# Patient Record
Sex: Female | Born: 1980 | Race: White | Hispanic: No | Marital: Single | Smoking: Former smoker
Health system: Southern US, Community
[De-identification: ages and names within clinical notes are randomized; demographics above are authoritative.]

## PROBLEM LIST (undated history)

## (undated) DIAGNOSIS — I471 Supraventricular tachycardia, unspecified: Secondary | ICD-10-CM

## (undated) DIAGNOSIS — Q796 Ehlers-Danlos syndrome, unspecified: Secondary | ICD-10-CM

## (undated) DIAGNOSIS — I951 Orthostatic hypotension: Secondary | ICD-10-CM

## (undated) DIAGNOSIS — F4310 Post-traumatic stress disorder, unspecified: Secondary | ICD-10-CM

## (undated) DIAGNOSIS — F341 Dysthymic disorder: Secondary | ICD-10-CM

## (undated) DIAGNOSIS — R5382 Chronic fatigue, unspecified: Secondary | ICD-10-CM

## (undated) DIAGNOSIS — Z8679 Personal history of other diseases of the circulatory system: Secondary | ICD-10-CM

## (undated) DIAGNOSIS — M797 Fibromyalgia: Secondary | ICD-10-CM

## (undated) DIAGNOSIS — F419 Anxiety disorder, unspecified: Secondary | ICD-10-CM

## (undated) DIAGNOSIS — M8580 Other specified disorders of bone density and structure, unspecified site: Secondary | ICD-10-CM

## (undated) DIAGNOSIS — F329 Major depressive disorder, single episode, unspecified: Secondary | ICD-10-CM

## (undated) DIAGNOSIS — O1490 Unspecified pre-eclampsia, unspecified trimester: Secondary | ICD-10-CM

## (undated) DIAGNOSIS — Q7962 Hypermobile Ehlers-Danlos syndrome: Secondary | ICD-10-CM

## (undated) DIAGNOSIS — G629 Polyneuropathy, unspecified: Secondary | ICD-10-CM

## (undated) DIAGNOSIS — Z79899 Other long term (current) drug therapy: Secondary | ICD-10-CM

## (undated) DIAGNOSIS — G5600 Carpal tunnel syndrome, unspecified upper limb: Secondary | ICD-10-CM

## (undated) DIAGNOSIS — N200 Calculus of kidney: Secondary | ICD-10-CM

## (undated) DIAGNOSIS — R000 Tachycardia, unspecified: Secondary | ICD-10-CM

## (undated) DIAGNOSIS — G90A Postural orthostatic tachycardia syndrome (POTS): Secondary | ICD-10-CM

## (undated) DIAGNOSIS — I498 Other specified cardiac arrhythmias: Secondary | ICD-10-CM

## (undated) HISTORY — PX: HERNIA REPAIR: SHX51

## (undated) HISTORY — DX: Supraventricular tachycardia: I47.1

## (undated) HISTORY — DX: Postural orthostatic tachycardia syndrome (POTS): G90.A

## (undated) HISTORY — DX: Post-traumatic stress disorder, unspecified: F43.10

## (undated) HISTORY — DX: Personal history of other diseases of the circulatory system: Z86.79

## (undated) HISTORY — DX: Supraventricular tachycardia, unspecified: I47.10

## (undated) HISTORY — DX: Hypermobile Ehlers-Danlos syndrome: Q79.62

## (undated) HISTORY — DX: Dysthymic disorder: F34.1

## (undated) HISTORY — DX: Orthostatic hypotension: I95.1

## (undated) HISTORY — DX: Anxiety disorder, unspecified: F41.9

## (undated) HISTORY — DX: Tachycardia, unspecified: R00.0

## (undated) HISTORY — DX: Major depressive disorder, single episode, unspecified: F32.9

## (undated) HISTORY — DX: Other long term (current) drug therapy: Z79.899

## (undated) HISTORY — PX: BREAST ENHANCEMENT SURGERY: SHX7

## (undated) HISTORY — DX: Calculus of kidney: N20.0

## (undated) HISTORY — DX: Other specified disorders of bone density and structure, unspecified site: M85.80

## (undated) HISTORY — DX: Fibromyalgia: M79.7

## (undated) HISTORY — DX: Chronic fatigue, unspecified: R53.82

## (undated) HISTORY — DX: Polyneuropathy, unspecified: G62.9

## (undated) HISTORY — DX: Ehlers-Danlos syndrome, unspecified: Q79.60

## (undated) HISTORY — DX: Carpal tunnel syndrome, unspecified upper limb: G56.00

## (undated) HISTORY — DX: Other specified cardiac arrhythmias: I49.8

## (undated) HISTORY — PX: ABDOMINOPLASTY: SUR9

---

## 1999-03-07 DIAGNOSIS — F32A Depression, unspecified: Secondary | ICD-10-CM

## 1999-03-07 HISTORY — DX: Depression, unspecified: F32.A

## 2016-10-24 DIAGNOSIS — G5602 Carpal tunnel syndrome, left upper limb: Secondary | ICD-10-CM | POA: Diagnosis not present

## 2016-10-24 DIAGNOSIS — Q796 Ehlers-Danlos syndrome: Secondary | ICD-10-CM | POA: Diagnosis not present

## 2016-10-24 DIAGNOSIS — M797 Fibromyalgia: Secondary | ICD-10-CM | POA: Diagnosis not present

## 2016-10-24 DIAGNOSIS — M150 Primary generalized (osteo)arthritis: Secondary | ICD-10-CM | POA: Diagnosis not present

## 2016-10-24 DIAGNOSIS — G5601 Carpal tunnel syndrome, right upper limb: Secondary | ICD-10-CM | POA: Diagnosis not present

## 2016-12-26 DIAGNOSIS — Q796 Ehlers-Danlos syndrome: Secondary | ICD-10-CM | POA: Diagnosis not present

## 2016-12-26 DIAGNOSIS — G5602 Carpal tunnel syndrome, left upper limb: Secondary | ICD-10-CM | POA: Diagnosis not present

## 2016-12-26 DIAGNOSIS — G5601 Carpal tunnel syndrome, right upper limb: Secondary | ICD-10-CM | POA: Diagnosis not present

## 2016-12-26 DIAGNOSIS — M150 Primary generalized (osteo)arthritis: Secondary | ICD-10-CM | POA: Diagnosis not present

## 2016-12-26 DIAGNOSIS — M797 Fibromyalgia: Secondary | ICD-10-CM | POA: Diagnosis not present

## 2017-01-04 DIAGNOSIS — M797 Fibromyalgia: Secondary | ICD-10-CM

## 2017-01-04 HISTORY — DX: Fibromyalgia: M79.7

## 2017-02-20 DIAGNOSIS — Z79891 Long term (current) use of opiate analgesic: Secondary | ICD-10-CM | POA: Diagnosis not present

## 2017-02-20 DIAGNOSIS — G5602 Carpal tunnel syndrome, left upper limb: Secondary | ICD-10-CM | POA: Diagnosis not present

## 2017-02-20 DIAGNOSIS — M797 Fibromyalgia: Secondary | ICD-10-CM | POA: Diagnosis not present

## 2017-02-20 DIAGNOSIS — G5601 Carpal tunnel syndrome, right upper limb: Secondary | ICD-10-CM | POA: Diagnosis not present

## 2017-02-20 DIAGNOSIS — M150 Primary generalized (osteo)arthritis: Secondary | ICD-10-CM | POA: Diagnosis not present

## 2017-02-20 DIAGNOSIS — Q796 Ehlers-Danlos syndrome: Secondary | ICD-10-CM | POA: Diagnosis not present

## 2017-02-21 DIAGNOSIS — M25562 Pain in left knee: Secondary | ICD-10-CM | POA: Diagnosis not present

## 2017-04-27 DIAGNOSIS — M150 Primary generalized (osteo)arthritis: Secondary | ICD-10-CM | POA: Diagnosis not present

## 2017-04-27 DIAGNOSIS — M797 Fibromyalgia: Secondary | ICD-10-CM | POA: Diagnosis not present

## 2017-04-27 DIAGNOSIS — Z79891 Long term (current) use of opiate analgesic: Secondary | ICD-10-CM | POA: Diagnosis not present

## 2017-04-27 DIAGNOSIS — G5601 Carpal tunnel syndrome, right upper limb: Secondary | ICD-10-CM | POA: Diagnosis not present

## 2017-04-27 DIAGNOSIS — Q796 Ehlers-Danlos syndrome: Secondary | ICD-10-CM | POA: Diagnosis not present

## 2017-04-27 DIAGNOSIS — G5602 Carpal tunnel syndrome, left upper limb: Secondary | ICD-10-CM | POA: Diagnosis not present

## 2017-05-08 DIAGNOSIS — F3342 Major depressive disorder, recurrent, in full remission: Secondary | ICD-10-CM | POA: Diagnosis not present

## 2017-05-08 DIAGNOSIS — K089 Disorder of teeth and supporting structures, unspecified: Secondary | ICD-10-CM | POA: Diagnosis not present

## 2017-06-22 DIAGNOSIS — M150 Primary generalized (osteo)arthritis: Secondary | ICD-10-CM | POA: Diagnosis not present

## 2017-06-22 DIAGNOSIS — M797 Fibromyalgia: Secondary | ICD-10-CM | POA: Diagnosis not present

## 2017-06-22 DIAGNOSIS — Q796 Ehlers-Danlos syndrome: Secondary | ICD-10-CM | POA: Diagnosis not present

## 2017-06-22 DIAGNOSIS — Z79891 Long term (current) use of opiate analgesic: Secondary | ICD-10-CM | POA: Diagnosis not present

## 2017-06-22 DIAGNOSIS — G5601 Carpal tunnel syndrome, right upper limb: Secondary | ICD-10-CM | POA: Diagnosis not present

## 2017-06-22 DIAGNOSIS — G5602 Carpal tunnel syndrome, left upper limb: Secondary | ICD-10-CM | POA: Diagnosis not present

## 2017-07-31 ENCOUNTER — Telehealth: Payer: Self-pay | Admitting: Osteopathic Medicine

## 2017-07-31 ENCOUNTER — Ambulatory Visit: Payer: Self-pay | Admitting: Osteopathic Medicine

## 2017-07-31 NOTE — Telephone Encounter (Signed)
PT arrived at 840 didn't realize appt was for arrival 810. Gave new appt for 6/4 arrival time and card states130pm.

## 2017-07-31 NOTE — Telephone Encounter (Signed)
No-show to establish care with Dr. Lyn Hollingshead, 07/31/17. If late or no-show again, will not accept patient to this clinic. Please call her to reschedule visit and inform them of this policy.

## 2017-08-07 ENCOUNTER — Ambulatory Visit (INDEPENDENT_AMBULATORY_CARE_PROVIDER_SITE_OTHER): Payer: Medicare Other | Admitting: Osteopathic Medicine

## 2017-08-07 ENCOUNTER — Encounter: Payer: Self-pay | Admitting: Osteopathic Medicine

## 2017-08-07 ENCOUNTER — Encounter (INDEPENDENT_AMBULATORY_CARE_PROVIDER_SITE_OTHER): Payer: Self-pay

## 2017-08-07 VITALS — BP 108/68 | HR 84 | Temp 98.0°F | Ht 67.0 in | Wt 147.0 lb

## 2017-08-07 DIAGNOSIS — F4310 Post-traumatic stress disorder, unspecified: Secondary | ICD-10-CM | POA: Insufficient documentation

## 2017-08-07 DIAGNOSIS — G8929 Other chronic pain: Secondary | ICD-10-CM

## 2017-08-07 DIAGNOSIS — Z8679 Personal history of other diseases of the circulatory system: Secondary | ICD-10-CM | POA: Insufficient documentation

## 2017-08-07 DIAGNOSIS — G629 Polyneuropathy, unspecified: Secondary | ICD-10-CM

## 2017-08-07 DIAGNOSIS — I471 Supraventricular tachycardia, unspecified: Secondary | ICD-10-CM

## 2017-08-07 DIAGNOSIS — F418 Other specified anxiety disorders: Secondary | ICD-10-CM | POA: Diagnosis not present

## 2017-08-07 DIAGNOSIS — R000 Tachycardia, unspecified: Secondary | ICD-10-CM

## 2017-08-07 DIAGNOSIS — Q796 Ehlers-Danlos syndrome: Secondary | ICD-10-CM

## 2017-08-07 DIAGNOSIS — M797 Fibromyalgia: Secondary | ICD-10-CM

## 2017-08-07 DIAGNOSIS — I951 Orthostatic hypotension: Secondary | ICD-10-CM

## 2017-08-07 DIAGNOSIS — G90A Postural orthostatic tachycardia syndrome (POTS): Secondary | ICD-10-CM

## 2017-08-07 DIAGNOSIS — Q7962 Hypermobile Ehlers-Danlos syndrome: Secondary | ICD-10-CM

## 2017-08-07 DIAGNOSIS — R5382 Chronic fatigue, unspecified: Secondary | ICD-10-CM

## 2017-08-07 DIAGNOSIS — M8580 Other specified disorders of bone density and structure, unspecified site: Secondary | ICD-10-CM | POA: Diagnosis not present

## 2017-08-07 DIAGNOSIS — G9332 Myalgic encephalomyelitis/chronic fatigue syndrome: Secondary | ICD-10-CM

## 2017-08-07 HISTORY — DX: Personal history of other diseases of the circulatory system: Z86.79

## 2017-08-07 HISTORY — DX: Supraventricular tachycardia: I47.1

## 2017-08-07 HISTORY — DX: Hypermobile Ehlers-Danlos syndrome: Q79.62

## 2017-08-07 HISTORY — DX: Supraventricular tachycardia, unspecified: I47.10

## 2017-08-07 HISTORY — DX: Fibromyalgia: M79.7

## 2017-08-07 HISTORY — DX: Myalgic encephalomyelitis/chronic fatigue syndrome: G93.32
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
## 2017-08-17 ENCOUNTER — Telehealth: Payer: Self-pay | Admitting: Osteopathic Medicine

## 2017-08-17 NOTE — Telephone Encounter (Signed)
Records were requested from [place] previous doctors [place].  I have not seen anything come across my desk yet.  Do we still have the release on file, particularly for her pain management specialist and PCP?  If so, can we call these offices and see if they can send us the records? Thanks

## 2017-08-20 NOTE — Telephone Encounter (Signed)
Records received and reviewed:  Last visit w/ pain mgt Dr Hazelton [facility]arma [facility] 06[facility], meds on list: Hydrocodone-APAP 10-325 mg 1 tab po q4 h (#180 Rx, 2 Rx's w/o refills) Ultram 50 mg 2 tab po q6 h (#240 Rx w/ 1 refill) Gabapentin 300 mg 1 tab po qhs (#30 w/ 5 refill) F/u advised in 2 mos - pt has since moved to [place]  Will scan to chart and pt will be provided a copy at her next visit w/ me tomorrow

## 2017-08-21 ENCOUNTER — Encounter: Payer: Self-pay | Admitting: Osteopathic Medicine

## 2017-08-21 ENCOUNTER — Ambulatory Visit (INDEPENDENT_AMBULATORY_CARE_PROVIDER_SITE_OTHER): Payer: Medicare Other | Admitting: Osteopathic Medicine

## 2017-08-21 VITALS — BP 114/84 | HR 109 | Temp 98.3°F | Wt 146.3 lb

## 2017-08-21 DIAGNOSIS — G629 Polyneuropathy, unspecified: Secondary | ICD-10-CM

## 2017-08-21 DIAGNOSIS — M797 Fibromyalgia: Secondary | ICD-10-CM

## 2017-08-21 DIAGNOSIS — G8929 Other chronic pain: Secondary | ICD-10-CM

## 2017-08-21 DIAGNOSIS — Q796 Ehlers-Danlos syndrome: Secondary | ICD-10-CM | POA: Diagnosis not present

## 2017-08-21 DIAGNOSIS — Z79899 Other long term (current) drug therapy: Secondary | ICD-10-CM

## 2017-08-21 DIAGNOSIS — Q7962 Hypermobile Ehlers-Danlos syndrome: Secondary | ICD-10-CM

## 2017-08-21 HISTORY — DX: Other long term (current) drug therapy: Z79.899

## 2017-08-21 MED ORDER — HYDROCODONE-ACETAMINOPHEN 10-325 MG PO TABS: 1.0000 | ORAL_TABLET | ORAL | 0 refills | Status: DC

## 2017-08-21 NOTE — Patient Instructions (Signed)
   Urine drug screen TODAY 08/21/17   Pain management referral: I'll follow up on this in the next couple weeks to see when they can get you in.   Let's plan to recheck things in another 4 weeks, see how you're doing on the Tramadol.

## 2017-08-21 NOTE — Progress Notes (Signed)
HPI: Marisa Harvey is a 37 y.o. female who  has a past medical history of Anxiety, Carpal tunnel syndrome, Chronic fatigue syndrome, Chronic fatigue syndrome with fibromyalgia (08/07/2017), Depression (2001), Ehlers-Danlos syndrome, Fibromyalgia (01/2017), Heart murmur, Mitral valve prolapse, Neuropathy, Osteopenia, POTS (postural orthostatic tachycardia syndrome), PTSD (post-traumatic stress disorder), and Supraventricular tachycardia (HCC).  she presents to [facility] today, 08/21/17,  for chief complaint of:  Pain management  Records received and reviewed:  Last visit w/ pain mgt Dr  Callas ([facility] in [facility]) 06/22/17, meds on list: Hydrocodone-APAP 10-325 mg 1 tab po q4 h (#180 Rx, 2 Rx's w/o refills) Ultram 50 mg 2 tab po q6 h (#240 Rx w/ 1 refill) Gabapentin 300 mg 1 tab po qhs (#30 w/ 5 refill) F/u advised in 2 mos - pt has since moved to [facility]   PMP reviewed: last  Fill of Hydrocodone and Tramadol was 07/23/17, due for refill tomorrow 08/22/17. See scanned docs for PMP review: has been filling #180 Hydrocodone-APAP and #240 of Tramadol pretty consistently on a monthly basis.   She reported at her visit 08/07/17 to establish care that she takes the Hydrocodone-APAP 6 AM, 10 AM, 2 PM, few times in the evening. If filling #180 per month. She reported that she uses the tramadol 2 tabs at a time a few times per day but isn't sure it's really helping much. .      Past medical history, surgical history, and family history reviewed.  Current medication list and allergy/intolerance information reviewed.   (See remainder of HPI, ROS, Phys Exam below)    ASSESSMENT/PLAN:   Ehlers-Danlos syndrome type III - chronic pain associated with this   Fibromyalgia - Plan: Pain Mgmt [facility] w/Conf, Ur  Neuropathy - Plan: Pain Mgmt [facility] w/Conf, Ur  Other chronic pain - she has tramaodl left over, "I'm not taking the tramadol as much as they give me" and not sure it's  helping, discussed take 1 instead of 2, see how it does?   Controlled substance agreement signed - signed 08/21/17 see scanned docs    Meds ordered this encounter  Medications  . HYDROcodone-acetaminophen (NORCO) 10-325 MG tablet    Sig: Take 1 tablet by mouth every 4 (four) hours.    Dispense:  180 tablet    Refill:  0    Patient Instructions   Urine drug screen TODAY 08/21/17   Pain management referral: I'll follow up on this in the next couple weeks to see when they can get you in.   Let's plan to recheck things in another 4 weeks, see how you're doing on the Tramadol.    Follow-up plan: Return in about 1 month (around 09/18/2017) for medication refills, sooner if needed .     ############################################ ############################################ ############################################ ############################################    Outpatient Encounter Medications as of 08/21/2017  Medication Sig  . atenolol (TENORMIN) 25 MG tablet Take 25 mg by mouth 2 (two) times daily.  . DULoxetine (CYMBALTA) 30 MG capsule Take 30 mg by mouth at bedtime.  . DULoxetine (CYMBALTA) 60 MG capsule Take 60 mg by mouth at bedtime.  Marland Kitchen escitalopram (LEXAPRO) 20 MG tablet Take 20 mg by mouth at bedtime.  . gabapentin (NEURONTIN) 300 MG capsule Take 300 mg by mouth at bedtime.  Marland Kitchen HYDROcodone-acetaminophen (NORCO) 10-325 MG tablet Take 1 tablet by mouth every 6 (six) hours as needed (taken at 6 am, 10 am & 2 pm).  . midodrine (PROAMATINE) 10 MG tablet Take 10 mg  by mouth 2 (two) times daily.  . traMADol (ULTRAM) 50 MG tablet Take 50 mg by mouth every 6 (six) hours as needed.   No facility-administered encounter medications on file as of 08/21/2017.    Allergies  Allergen Reactions  . Midodrine     "Makes my head tingle like 15 mins after I take it"      Review of Systems:  Constitutional: No recent illness  HEENT: No  headache, no vision change  Cardiac: No   chest pain, No  pressure, No palpitations  Respiratory:  No  shortness of breath.   Gastrointestinal: No  abdominal pain  Musculoskeletal: No new myalgia/arthralgia  Neurologic: No  weakness, No  Dizziness   Exam:  BP 114/84 (BP Location: Left Arm, Patient Position: Sitting, Cuff Size: Normal)   Pulse (!) 109   Temp 98.3 F (36.8 C) (Oral)   Wt 146 lb 4.8 oz (66.4 kg)   LMP 08/06/2017   BMI 22.91 kg/m    Constitutional: VS see above. General Appearance: alert, well-developed, well-nourished, NAD  Respiratory: Normal respiratory effort.   Musculoskeletal: Gait normal. Symmetric and independent movement of all extremities  Neurological: Normal balance/coordination. No tremor.  Skin: warm, dry, intact.   Psychiatric: Normal judgment/insight. Normal mood and affect. Oriented x3.   Visit summary with medication list and pertinent instructions was printed for patient to review, advised to alert us if any changes needed. All questions at time of visit were answered - patient instructed to contact office with any additional concerns. ER/RTC precautions were reviewed with the patient and understanding verbalized.   Follow-up plan: Return in about 1 month (around 09/18/2017) for medication refills, sooner if needed .  Note: Total time spent 25 minutes, greater than 50% of the visit was spent face-to-face counseling and coordinating care for the following: The primary encounter diagnosis was Ehlers-Danlos syndrome type III. Diagnoses of Fibromyalgia, Neuropathy, and Other chronic pain were also pertinent to this visit.Marland Kitchen.  Please note: voice recognition software was used to produce this document, and typos may escape review. Please contact Dr. Lyn HollingsheadAlexander for any needed clarifications.

## 2017-08-25 LAB — PAIN MGMT BASE W/CONF, UR
ALPHAHYDROXYTRIAZOLAM: NEGATIVE ng/mL (ref <50)
Alphahydroxyalprazolam: NEGATIVE ng/mL (ref <25)
Alphahydroxymidazolam: NEGATIVE ng/mL (ref <50)
Benzodiazepines: NEGATIVE ng/mL (ref <100)
COCAINE METABOLITE: NEGATIVE ng/mL (ref <150)
CODEINE: NEGATIVE ng/mL (ref <50)
Creatinine: 298.5 mg/dL
Hydrocodone: 6590 ng/mL — ABNORMAL HIGH (ref <50)
Hydromorphone: 725 ng/mL — ABNORMAL HIGH (ref <50)
Hydroxyethylflurazepam: NEGATIVE ng/mL (ref <50)
Lorazepam: NEGATIVE ng/mL (ref <50)
MORPHINE: NEGATIVE ng/mL (ref <50)
NORDIAZEPAM: NEGATIVE ng/mL (ref <50)
NORHYDROCODONE: 10384 ng/mL — AB (ref <50)
OXYCODONE: NEGATIVE ng/mL (ref <100)
Opiates: POSITIVE ng/mL — AB (ref <100)
Oxazepam: NEGATIVE ng/mL (ref <50)
Oxidant: NEGATIVE ug/mL (ref <200)
Temazepam: NEGATIVE ng/mL (ref <50)
pH: 7.09 (ref 4.5–9.0)

## 2017-08-28 ENCOUNTER — Telehealth: Payer: Self-pay | Admitting: Osteopathic Medicine

## 2017-08-28 NOTE — Telephone Encounter (Signed)
-----   Message from Sunnie NielsenNatalie Alexander, DO sent at 08/28/2017 11:12 AM EDT ----- Regarding: FW: declined referral [facility] referral - anywhere else we can try?  Can let patient know - will try to get her in elsewhere but I am ok to continue meds as-is, though if there are ever any changes needed I'd like the input of a specialist.   Thanks!   ----- Message ----- From: Jens SomKellner, Lisa L Sent: 08/28/2017  10:14 AM To: Sunnie NielsenNatalie Alexander, DO Subject: declined referral                              Per clinical review nothing further we can offer  - Thank you for the referral

## 2017-08-29 NOTE — Telephone Encounter (Signed)
Done

## 2017-08-30 ENCOUNTER — Encounter: Payer: Self-pay | Admitting: Osteopathic Medicine

## 2017-08-30 DIAGNOSIS — G8929 Other chronic pain: Secondary | ICD-10-CM

## 2017-08-30 DIAGNOSIS — Q7962 Hypermobile Ehlers-Danlos syndrome: Secondary | ICD-10-CM

## 2017-08-30 DIAGNOSIS — G629 Polyneuropathy, unspecified: Secondary | ICD-10-CM

## 2017-08-30 DIAGNOSIS — M797 Fibromyalgia: Secondary | ICD-10-CM

## 2017-08-30 NOTE — Progress Notes (Signed)
New referral placed for Pt to try a different facility.

## 2017-09-17 ENCOUNTER — Ambulatory Visit (INDEPENDENT_AMBULATORY_CARE_PROVIDER_SITE_OTHER): Payer: Medicare Other | Admitting: Licensed Clinical Social Worker

## 2017-09-17 DIAGNOSIS — F4010 Social phobia, unspecified: Secondary | ICD-10-CM

## 2017-09-17 DIAGNOSIS — F4310 Post-traumatic stress disorder, unspecified: Secondary | ICD-10-CM

## 2017-09-17 DIAGNOSIS — F341 Dysthymic disorder: Secondary | ICD-10-CM

## 2017-09-18 ENCOUNTER — Encounter (HOSPITAL_COMMUNITY): Payer: Self-pay | Admitting: Licensed Clinical Social Worker

## 2017-09-18 DIAGNOSIS — F341 Dysthymic disorder: Secondary | ICD-10-CM

## 2017-09-18 DIAGNOSIS — M797 Fibromyalgia: Secondary | ICD-10-CM | POA: Insufficient documentation

## 2017-09-18 HISTORY — DX: Dysthymic disorder: F34.1
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
## 2017-09-19 ENCOUNTER — Encounter: Payer: Self-pay | Admitting: Osteopathic Medicine

## 2017-09-19 ENCOUNTER — Ambulatory Visit (INDEPENDENT_AMBULATORY_CARE_PROVIDER_SITE_OTHER): Payer: Medicare Other | Admitting: Osteopathic Medicine

## 2017-09-19 VITALS — BP 111/76 | HR 95 | Temp 99.2°F | Wt 145.9 lb

## 2017-09-19 DIAGNOSIS — Z23 Encounter for immunization: Secondary | ICD-10-CM

## 2017-09-19 DIAGNOSIS — Q7962 Hypermobile Ehlers-Danlos syndrome: Secondary | ICD-10-CM

## 2017-09-19 DIAGNOSIS — G8929 Other chronic pain: Secondary | ICD-10-CM

## 2017-09-19 DIAGNOSIS — Q796 Ehlers-Danlos syndrome: Secondary | ICD-10-CM | POA: Diagnosis not present

## 2017-09-19 MED ORDER — HYDROCODONE-ACETAMINOPHEN 10-325 MG PO TABS: 1.0000 | ORAL_TABLET | ORAL | 0 refills | Status: DC

## 2017-09-19 NOTE — Progress Notes (Signed)
HPI: Marisa Harvey is a 37 y.o. female who  has a past medical history of Anxiety, Carpal tunnel syndrome, Chronic fatigue syndrome, Chronic fatigue syndrome with fibromyalgia (08/07/2017), Depression (2001), Ehlers-Danlos syndrome, Fibromyalgia (01/2017), Heart murmur, Mitral valve prolapse, Neuropathy, Osteopenia, POTS (postural orthostatic tachycardia syndrome), PTSD (post-traumatic stress disorder), and Supraventricular tachycardia (HCC).  she [facility] today, 09/19/17,  for chief complaint of:  Pain management  Patient reports no change since last visit, overall is doing really well on current medication regimen.  Records received and reviewed:  Last visit w/ pain mgt Dr Creighton [facility]arma [facility] [place] [facility] 06/22/17, meds on list: Hydrocodone-APAP 10-325 mg 1 tab po q4 h (#180 Rx, 2 Rx's w/o refills) Ultram 50 mg 2 tab po q6 h (#240 Rx w/ 1 refill) Gabapentin 300 mg 1 tab po qhs (#30 w/ 5 refill) F/u advised in 2 mos - pt has since moved to [place]   PMP reviewed today 09/19/17: last  Fill of Hydrocodone 08/21/17 #1380 x30 days from me, and Tramadol was written 06/22/17 by Dr [place] CallasSharma and filled 08/25/17 for #240. See scanned docs for PMP review: has been filling #180 Hydrocodone-APAP and #240 of Tramadol pretty consistently on a monthly basis.  Patient brings a notebook with her today, she has been keeping track of how much tramadol she is actually needing, has tried cutting back a bit on this.  Finding that she averages may be 4 to 6/day.     Past medical history, surgical history, and family history reviewed.  Current medication list and allergy/intolerance information reviewed.   (See remainder of HPI, ROS, Phys Exam below)    ASSESSMENT/PLAN:   Ehlers-Danlos syndrome type III - [facility]; pt also ok to [facility] [place] [facility] annually when she's there visiting family   Other chronic pain - Will consult  with pain management, has appt upcoming 10/08/17  Need for Tdap vaccination - Plan: Tdap vaccine greater than or equal to 7yo IM   Meds ordered this encounter  Medications  . HYDROcodone-acetaminophen (NORCO) 10-325 MG tablet    Sig: Take 1 tablet by mouth every 4 (four) hours.    Dispense:  180 tablet    Refill:  0    Plan to keep appointment with pain management, further refills per their expertise.  Any additional problems or concerns, can definitely come see me.  Has established with therapist downstairs, this is going well.  Has upcoming appointment with cardiology for history of mitral valve disease.  Follow-up plan: Return in about 6 months (around 03/22/2018) [facility] - sooner if needed .     ############################################ ############################################ ############################################ ############################################    Outpatient Encounter Medications as of 09/19/2017  Medication Sig  . atenolol (TENORMIN) 25 MG tablet Take 25 mg by mouth 2 (two) times daily.  . DULoxetine (CYMBALTA) 30 MG capsule Take 30 mg by mouth at bedtime.  . DULoxetine (CYMBALTA) 60 MG capsule Take 60 mg by mouth at bedtime.  Marland Kitchen. escitalopram (LEXAPRO) 20 MG tablet Take 20 mg by mouth at bedtime.  . gabapentin (NEURONTIN) 300 MG capsule Take 300 mg by mouth at bedtime.  Marland Kitchen. HYDROcodone-acetaminophen (NORCO) 10-325 MG tablet Take 1 tablet by mouth every 4 (four) hours.  . midodrine (PROAMATINE) 10 MG tablet Take 10 mg by mouth 2 (two) times daily.  . traMADol (ULTRAM) 50 MG tablet Take 50 mg by mouth every 6 (six) hours  as needed.   No facility-administered encounter medications on file as of 09/19/2017.    No Known Allergies    Review of Systems:  Constitutional: No recent illness  Cardiac: No  chest pain, No  pressure, No palpitations  Respiratory:  No  shortness of breath.   Gastrointestinal: No  abdominal  pain  Musculoskeletal: No new myalgia/arthralgia  Neurologic: No  weakness, No  Dizziness   Exam:  BP 111/76 (BP Location: Left Arm, Patient Position: Sitting, Cuff Size: Normal)   Pulse 95   Temp 99.2 F (37.3 C) (Oral)   Wt 145 lb 14.4 oz (66.2 kg)   BMI 22.85 kg/m    Constitutional: VS see above. General Appearance: alert, well-developed, well-nourished, NAD  Respiratory: Normal respiratory effort.   Musculoskeletal: Gait normal. Symmetric and independent movement of all extremities  Neurological: Normal balance/coordination. No tremor.  Skin: warm, dry, intact.   Psychiatric: Normal judgment/insight. Normal mood and affect. Oriented x3.   Visit summary with medication list and pertinent instructions was printed for patient to review, advised to alert [place] if any changes needed. All questions at time of visit were answered - patient instructed to contact office with any additional concerns. ER/RTC precautions were reviewed with the patient and understanding verbalized.   Follow-up plan: No follow-ups on file.  Note: Total time spent 25 minutes, greater than 50% of the visit was spent face-to-face counseling and coordinating care for the following: The primary encounter diagnosis was Need for Tdap vaccination. Diagnoses of Ehlers-Danlos syndrome type III and Other chronic pain were also pertinent to this visit.Marland Kitchen  Please note: voice recognition software was used to produce this document, and typos may escape review. Please contact Dr. Lyn Hollingshead for any needed clarifications.

## 2017-09-30 ENCOUNTER — Encounter: Payer: Self-pay | Admitting: Osteopathic Medicine

## 2017-09-30 DIAGNOSIS — G8929 Other chronic pain: Secondary | ICD-10-CM

## 2017-09-30 DIAGNOSIS — Q7962 Hypermobile Ehlers-Danlos syndrome: Secondary | ICD-10-CM

## 2017-10-01 ENCOUNTER — Ambulatory Visit (HOSPITAL_COMMUNITY): Payer: Medicare Other | Admitting: Licensed Clinical Social Worker

## 2017-10-01 MED ORDER — TRAMADOL HCL 50 MG PO TABS: 50.0000 mg | ORAL_TABLET | Freq: Four times a day (QID) | ORAL | 0 refills | Status: AC | PRN

## 2017-10-15 ENCOUNTER — Encounter: Payer: Self-pay | Admitting: Osteopathic Medicine

## 2017-10-16 ENCOUNTER — Encounter: Payer: Self-pay | Admitting: Osteopathic Medicine

## 2017-10-17 MED ORDER — HYDROCODONE-ACETAMINOPHEN 10-325 MG PO TABS: 1.0000 | ORAL_TABLET | ORAL | 0 refills | Status: AC

## 2017-10-17 NOTE — Telephone Encounter (Signed)
PMP reviewed, refill sent.  F/u pain mgt

## 2017-10-22 ENCOUNTER — Ambulatory Visit (HOSPITAL_COMMUNITY): Payer: Medicare Other | Admitting: Licensed Clinical Social Worker

## 2017-10-29 NOTE — Progress Notes (Signed)
Referring-Natalie Alexander, DO Reason for referral-mitral valve prolapse, POTS  HPI: 37 year old female for evaluation of mitral valve prolapse and POTS.  Note history of Ehlers-Danlos syndrome.  Patient is from [place].  She apparently has had supraventricular tachycardia since age 77.  She has sudden onset of palpitations described as her heart racing.  They will sometimes resolve with deep breaths but other times she puts her head in a freezer.  These typically last several minutes.  There is associated chest pain.  She also has a long history of POTS by her report.  Her symptoms have worsened recently.  She stands and feels dizzy and will have frank syncope several times weekly.  No preceding chest pain or palpitations.  She apparently has had monitors in the past that show her SVT and other evaluations that show the above.  She now presents for further evaluation.  Current Outpatient Medications  Medication Sig Dispense Refill  . atenolol (TENORMIN) 25 MG tablet Take 25 mg by mouth 2 (two) times daily.    . DULoxetine (CYMBALTA) 30 MG capsule Take 30 mg by mouth at bedtime.    . DULoxetine (CYMBALTA) 60 MG capsule Take 60 mg by mouth at bedtime.    Marland Kitchen escitalopram (LEXAPRO) 20 MG tablet Take 20 mg by mouth at bedtime.    . gabapentin (NEURONTIN) 300 MG capsule Take 300 mg by mouth at bedtime.    Marland Kitchen HYDROcodone-acetaminophen (NORCO) 10-325 MG tablet Take 1 tablet by mouth every 4 (four) hours. 180 tablet 0  . midodrine (PROAMATINE) 10 MG tablet Take 10 mg by mouth 2 (two) times daily.    . traMADol (ULTRAM) 50 MG tablet Take 1-2 tablets (50-100 mg total) by mouth every 6 (six) hours as needed. 180 tablet 0   No current facility-administered medications for this visit.     No Known Allergies   Past Medical History:  Diagnosis Date  . Anxiety   . Carpal tunnel syndrome   . Chronic fatigue syndrome with fibromyalgia 08/07/2017  . Controlled substance agreement signed 08/21/2017   signed 08/21/17 see scanned docs   . Depression 2001  . Ehlers-Danlos syndrome type III 08/07/2017  . Fibromyalgia 01/2017  . History of mitral valve prolapse 08/07/2017  . Nephrolithiasis   . Neuropathy   . Osteopenia   . Persistent depressive disorder 09/18/2017  . POTS (postural orthostatic tachycardia syndrome)    with blackouts and tremors  . PTSD (post-traumatic stress disorder)   . Supraventricular tachycardia (HCC)   . SVT (supraventricular tachycardia) (HCC) 08/07/2017    Past Surgical History:  Procedure Laterality Date  . ABDOMINOPLASTY    . BREAST ENHANCEMENT SURGERY    . CESAREAN SECTION     2 pregnancies  . HERNIA REPAIR      Social History   Socioeconomic History  . Marital status: Single    Spouse name: Not on file  . Number of children: 2  . Years of education: Not on file  . Highest education level: Not on file  Occupational History  . Occupation: disabled  Social Needs  . Financial resource strain: Not on file  . Food insecurity:    Worry: Not on file    Inability: Not on file  . Transportation needs:    Medical: Not on file    Non-medical: Not on file  Tobacco Use  . Smoking status: Former Smoker    Packs/day: 1.00    Years: 10.00    Pack years: 10.00  Last attempt to quit: 11/05/2014    Years since quitting: 2.9  . Smokeless tobacco: Never Used  Substance and Sexual Activity  . Alcohol use: Not Currently    Comment: rare, few times per year   . Drug use: Never  . Sexual activity: Yes    Partners: Male    Birth control/protection: Condom    Comment: married  Lifestyle  . Physical activity:    Days per week: Not on file    Minutes per session: Not on file  . Stress: Not on file  Relationships  . Social connections:    Talks on phone: Not on file    Gets together: Not on file    Attends religious service: Not on file    Active member of club or organization: Not on file    Attends meetings of clubs or organizations: Not on file     Relationship status: Not on file  . Intimate partner violence:    Fear of current or ex partner: Not on file    Emotionally abused: Not on file    Physically abused: Not on file    Forced sexual activity: Not on file  Other Topics Concern  . Not on file  Social History Narrative  . Not on file    Family History  Problem Relation Age of Onset  . Ehlers-Danlos syndrome Mother     ROS: Fibromyalgia pains and fatigue but no fevers or chills, productive cough, hemoptysis, dysphasia, odynophagia, melena, hematochezia, dysuria, hematuria, rash, seizure activity, orthopnea, PND, pedal edema, claudication. Remaining systems are negative.  Physical Exam:   Blood pressure 101/78, pulse 82, height 5\' 7"  (1.702 m), weight 143 lb (64.9 kg).  General:  Well developed/well nourished in NAD Skin warm/dry Patient not depressed No peripheral clubbing Back-normal HEENT-normal/normal eyelids Neck supple/normal carotid upstroke bilaterally; no bruits; no JVD; no thyromegaly chest - CTA/ normal expansion CV - RRR/normal S1 and S2; no murmurs, rubs or gallops;  PMI nondisplaced Abdomen -NT/ND, no HSM, no mass, + bowel sounds, no bruit 2+ femoral pulses, no bruits Ext-no edema, chords, 2+ DP Neuro-grossly nonfocal  ECG -normal sinus rhythm at a rate of 82.  No ST changes.  Personally reviewed  A/P  1 supraventricular tachycardia-we will arrange an event monitor to further assess.  I have also asked her to bring records from her previous evaluation.  We will continue atenolol for now.  I will ask her to see Dr. Graciela HusbandsKlein.  Unclear if she would be a good candidate for ablation given Ehlers-Danlos and potential for tissue/vascular fragility.  If she were a candidate then atenolol could potentially be discontinued.  2 syncope-patient apparently has been diagnosed with POTS previously.  Continue hydration and increased sodium intake.  Continue midodrine.  I will ask Dr. Graciela HusbandsKlein to evaluate for further  therapies.  Note I have asked the patient not to drive until she is seen by Dr. Graciela HusbandsKlein.  3 history of mitral valve prolapse-we will arrange an echocardiogram to further assess.  4 history of Ehlers-Danlos syndrome-listed as type III.  However would need outside records concerning previous evaluation.  Unclear if this subpopulation includes tissue/vascular fragility as in other types.  If so ablation may not be advisable.  Olga MillersBrian Carlisia Geno, MD

## 2017-10-31 ENCOUNTER — Encounter: Payer: Self-pay | Admitting: Cardiology

## 2017-10-31 ENCOUNTER — Ambulatory Visit (INDEPENDENT_AMBULATORY_CARE_PROVIDER_SITE_OTHER): Payer: Medicare Other | Admitting: Cardiology

## 2017-10-31 VITALS — BP 101/78 | HR 82 | Ht 67.0 in | Wt 143.0 lb

## 2017-10-31 DIAGNOSIS — Q796 Ehlers-Danlos syndrome, unspecified: Secondary | ICD-10-CM

## 2017-10-31 DIAGNOSIS — I341 Nonrheumatic mitral (valve) prolapse: Secondary | ICD-10-CM | POA: Diagnosis not present

## 2017-10-31 DIAGNOSIS — R000 Tachycardia, unspecified: Secondary | ICD-10-CM | POA: Diagnosis not present

## 2017-10-31 DIAGNOSIS — I951 Orthostatic hypotension: Secondary | ICD-10-CM | POA: Diagnosis not present

## 2017-10-31 DIAGNOSIS — I471 Supraventricular tachycardia: Secondary | ICD-10-CM | POA: Diagnosis not present

## 2017-10-31 DIAGNOSIS — R55 Syncope and collapse: Secondary | ICD-10-CM | POA: Diagnosis not present

## 2017-10-31 DIAGNOSIS — G90A Postural orthostatic tachycardia syndrome (POTS): Secondary | ICD-10-CM

## 2017-10-31 NOTE — Patient Instructions (Signed)
Medication Instructions:   NO CHANGE  Testing/Procedures:  Your physician has recommended that you wear an event monitor. Event monitors are medical devices that record the heart's electrical activity. Doctors most often us these monitors to diagnose arrhythmias. Arrhythmias are problems with the speed or rhythm of the heartbeat. The monitor is a small, portable device. You can wear one while you do your normal daily activities. This is usually used to diagnose what is causing palpitations/syncope (passing out).   Your physician has requested that you have an echocardiogram. Echocardiography is a painless test that uses sound waves to create images of your heart. It provides your doctor with information about the size and shape of your heart and how well your heart's chambers and valves are working. This procedure takes approximately one hour. There are no restrictions for this procedure.    Follow-Up:  Your physician recommends that you schedule a follow-up appointment in: 4 MONTHS WITH DR CRENSHAW   APPOINTMENT WITH DR Berton MountSTEVE KLEIN FOR SVT AND POTS

## 2017-11-01 DIAGNOSIS — Q796 Ehlers-Danlos syndrome: Secondary | ICD-10-CM | POA: Diagnosis not present

## 2017-11-01 DIAGNOSIS — Z5181 Encounter for therapeutic drug level monitoring: Secondary | ICD-10-CM | POA: Diagnosis not present

## 2017-11-01 DIAGNOSIS — G894 Chronic pain syndrome: Secondary | ICD-10-CM | POA: Diagnosis not present

## 2017-11-01 DIAGNOSIS — Z79899 Other long term (current) drug therapy: Secondary | ICD-10-CM | POA: Diagnosis not present

## 2017-11-13 ENCOUNTER — Ambulatory Visit (INDEPENDENT_AMBULATORY_CARE_PROVIDER_SITE_OTHER): Payer: Medicare Other

## 2017-11-13 ENCOUNTER — Other Ambulatory Visit: Payer: Self-pay

## 2017-11-13 ENCOUNTER — Ambulatory Visit (HOSPITAL_COMMUNITY): Payer: Medicare Other | Attending: Cardiology

## 2017-11-13 DIAGNOSIS — I471 Supraventricular tachycardia: Secondary | ICD-10-CM

## 2017-11-13 DIAGNOSIS — I341 Nonrheumatic mitral (valve) prolapse: Secondary | ICD-10-CM | POA: Insufficient documentation

## 2017-11-13 DIAGNOSIS — M797 Fibromyalgia: Secondary | ICD-10-CM | POA: Insufficient documentation

## 2017-11-13 DIAGNOSIS — R5382 Chronic fatigue, unspecified: Secondary | ICD-10-CM | POA: Insufficient documentation

## 2017-11-19 ENCOUNTER — Telehealth: Payer: Self-pay | Admitting: *Deleted

## 2017-11-19 NOTE — Telephone Encounter (Signed)
Received notification from [employer]:  9/14 at 12:22 episode of SVT (1 min) rate of 177, auto trigger 9/14 12:23 SVT rate of 174 (60 secs) patient triggered.   Per chart review: hx of SVT on atenolol Appt with Dr. Graciela HusbandsKlein 9/19   Called patient-she states she was unaware of episode.   She was asymptomatic.   She states this happened when she wore a monitor years ago and was never aware of it.    Advised to keep appt with Dr. Graciela HusbandsKlein and would make Dr. Jens Somrenshaw aware.   Will fax strips to Dr. Graciela HusbandsKlein for OV

## 2017-11-19 NOTE — Telephone Encounter (Signed)
Fu SK as scheduled Olga MillersBrian Crenshaw

## 2017-11-22 ENCOUNTER — Institutional Professional Consult (permissible substitution): Payer: Self-pay | Admitting: Internal Medicine

## 2017-11-30 ENCOUNTER — Encounter: Payer: Self-pay | Admitting: Osteopathic Medicine

## 2017-11-30 DIAGNOSIS — F418 Other specified anxiety disorders: Secondary | ICD-10-CM

## 2017-12-03 ENCOUNTER — Encounter: Payer: Self-pay | Admitting: Osteopathic Medicine

## 2017-12-03 ENCOUNTER — Other Ambulatory Visit: Payer: Self-pay | Admitting: Family Medicine

## 2017-12-03 DIAGNOSIS — F418 Other specified anxiety disorders: Secondary | ICD-10-CM

## 2017-12-03 MED ORDER — ESCITALOPRAM OXALATE 10 MG PO TABS: 10.0000 mg | ORAL_TABLET | Freq: Every day | ORAL | 1 refills | Status: DC

## 2017-12-03 NOTE — Telephone Encounter (Signed)
I will refill her lexapro for 10mg . I do not feel safe with writing this while on Cymbalta 90 mg. Let's refer her to psych who would likely be more experienced with writing this level of med. SH was gently getting this from a psychiatrist in [place].  So it makes sense for her to transition to another psychiatrist here locally.  Certainly if she has a preference for where she would like to go than we can accommodate that.

## 2017-12-03 NOTE — Telephone Encounter (Signed)
Patient advised of Dr Shelah Lewandowsky recommendations. She states she would rather not see a psychiatrist for this if she doesn't have to. She wanted me to route to Dr Lyn Hollingshead.

## 2017-12-05 NOTE — Telephone Encounter (Signed)
Please see previous note.  po

## 2017-12-07 DIAGNOSIS — G894 Chronic pain syndrome: Secondary | ICD-10-CM | POA: Diagnosis not present

## 2017-12-07 DIAGNOSIS — Q7962 Hypermobile Ehlers-Danlos syndrome: Secondary | ICD-10-CM | POA: Diagnosis not present

## 2017-12-07 MED ORDER — ESCITALOPRAM OXALATE 20 MG PO TABS: 20.0000 mg | ORAL_TABLET | Freq: Every day | ORAL | 1 refills | Status: DC

## 2017-12-10 ENCOUNTER — Institutional Professional Consult (permissible substitution): Payer: Self-pay | Admitting: Internal Medicine

## 2017-12-27 ENCOUNTER — Encounter: Payer: Self-pay | Admitting: Internal Medicine

## 2017-12-27 ENCOUNTER — Ambulatory Visit (INDEPENDENT_AMBULATORY_CARE_PROVIDER_SITE_OTHER): Payer: Medicare Other | Admitting: Internal Medicine

## 2017-12-27 VITALS — HR 88 | Ht 67.0 in | Wt 140.0 lb

## 2017-12-27 DIAGNOSIS — Q796 Ehlers-Danlos syndrome, unspecified: Secondary | ICD-10-CM | POA: Diagnosis not present

## 2017-12-27 DIAGNOSIS — I471 Supraventricular tachycardia: Secondary | ICD-10-CM | POA: Diagnosis not present

## 2017-12-27 DIAGNOSIS — G90A Postural orthostatic tachycardia syndrome (POTS): Secondary | ICD-10-CM

## 2017-12-27 DIAGNOSIS — I951 Orthostatic hypotension: Secondary | ICD-10-CM | POA: Diagnosis not present

## 2017-12-27 DIAGNOSIS — R000 Tachycardia, unspecified: Secondary | ICD-10-CM

## 2017-12-27 DIAGNOSIS — I341 Nonrheumatic mitral (valve) prolapse: Secondary | ICD-10-CM

## 2017-12-27 NOTE — Progress Notes (Signed)
ELECTROPHYSIOLOGY CONSULT NOTE  Patient ID: Marisa Harvey, MRN: [id number], DOB/AGE: [birth year] 37 y.o. Admit date: (Not on file) Date of Consult: 12/27/2017  Primary Physician: Sunnie Nielsen, DO Primary Cardiologist: [facility]     Marisa Harvey is a 37 y.o. female who is being seen today for the evaluation of SVT at the request of BC.    HPI Marisa Harvey is a 37 y.o. female with a 25-year history of "SVT ".  Apparently presented on the night of the eighth grade dance with tachycardia.  She was diagnosed with SVT was treated with atenolol and has been on it ever since.  She thinks it helps.  About 10 years ago she was seen at the [facility] following an episode of syncope that was complicated by a concussion.  She apparently underwent tilt table testing and was diagnosed with POTS.  She was treated with ProAmatine which she is taking ever since complicated by cranial pruritus.  She has mitral valve prolapse recently confirmed to be mild by echocardiogram  She has been diagnosed also with Ehlers-Danlos syndrome.  This is described as joint hypermobility; however, there have been other more systemic features attributed to the EDS including teeth fragility, She was seen at [facility] genetics team where that diagnosis was clarified.  Her mother her grandmother and her older son has been diagnosed with EDS.  Over the last couple of years she has had increasing symptoms of orthostatic intolerance.  She describes this is feeling like her eyes are closing in.  Sometimes her heart rate goes fast, sometimes flips.  These episodes are associated with fatigue in the recovery phase.  More recently, she has had actually episodes of "shaking in her core".  Because of this hurting, she sought medical attention.  She has a history of depression.  She describes it is major depression with suicidal ideation that comes on with little warning.  She is on psychiatric meds and sees a Veterinary surgeon.  She is in tears  as she describes what she has lost.  Her diet is replete sodium and fluid.  No shower intolerance no menses intolerance some heat intolerance.       Past Medical History:  Diagnosis Date  . Anxiety   . Carpal tunnel syndrome   . Chronic fatigue syndrome with fibromyalgia 08/07/2017  . Controlled substance agreement signed 08/21/2017   signed 08/21/17 see scanned docs   . Depression 2001  . Ehlers-Danlos syndrome type III 08/07/2017  . Fibromyalgia 01/2017  . History of mitral valve prolapse 08/07/2017  . Nephrolithiasis   . Neuropathy   . Osteopenia   . Persistent depressive disorder 09/18/2017  . POTS (postural orthostatic tachycardia syndrome)    with blackouts and tremors  . PTSD (post-traumatic stress disorder)   . Supraventricular tachycardia (HCC)   . SVT (supraventricular tachycardia) (HCC) 08/07/2017      Surgical History:  Past Surgical History:  Procedure Laterality Date  . ABDOMINOPLASTY    . BREAST ENHANCEMENT SURGERY    . CESAREAN SECTION     2 pregnancies  . HERNIA REPAIR       Home Meds: Current Meds  Medication Sig  . atenolol (TENORMIN) 25 MG tablet Take 25 mg by mouth 2 (two) times daily.  . DULoxetine (CYMBALTA) 30 MG capsule Take 30 mg by mouth at bedtime.  . DULoxetine (CYMBALTA) 60 MG capsule Take 60 mg by mouth at bedtime.  Marland Kitchen escitalopram (LEXAPRO) 20 MG tablet Take 1 tablet (20 mg total)  by mouth daily.  Marland Kitchen gabapentin (NEURONTIN) 300 MG capsule Take 300 mg by mouth at bedtime.  Melene Muller ON 01/16/2018] HYDROcodone-acetaminophen (NORCO) 10-325 MG tablet Take 1 tablet by mouth every 4 (four) hours.  . midodrine (PROAMATINE) 10 MG tablet Take 10 mg by mouth 2 (two) times daily.  . traMADol (ULTRAM) 50 MG tablet Take 1-2 tablets (50-100 mg total) by mouth every 6 (six) hours as needed.    Allergies: No Known Allergies  Social History   Socioeconomic History  . Marital status: Single    Spouse name: Not on file  . Number of children: 2  . Years  of education: Not on file  . Highest education level: Not on file  Occupational History  . Occupation: disabled  Social Needs  . Financial resource strain: Not on file  . Food insecurity:    Worry: Not on file    Inability: Not on file  . Transportation needs:    Medical: Not on file    Non-medical: Not on file  Tobacco Use  . Smoking status: Former Smoker    Packs/day: 1.00    Years: 10.00    Pack years: 10.00    Last attempt to quit: 11/05/2014    Years since quitting: 3.1  . Smokeless tobacco: Never Used  Substance and Sexual Activity  . Alcohol use: Not Currently    Comment: rare, few times per year   . Drug use: Never  . Sexual activity: Yes    Partners: Male    Birth control/protection: Condom    Comment: married  Lifestyle  . Physical activity:    Days per week: Not on file    Minutes per session: Not on file  . Stress: Not on file  Relationships  . Social connections:    Talks on phone: Not on file    Gets together: Not on file    Attends religious service: Not on file    Active member of club or organization: Not on file    Attends meetings of clubs or organizations: Not on file    Relationship status: Not on file  . Intimate partner violence:    Fear of current or ex partner: Not on file    Emotionally abused: Not on file    Physically abused: Not on file    Forced sexual activity: Not on file  Other Topics Concern  . Not on file  Social History Narrative  . Not on file     Family History  Problem Relation Age of Onset  . Ehlers-Danlos syndrome Mother   . Ehlers-Danlos syndrome Maternal Grandmother   . Behavior problems Sister      ROS:  Please see the history of present illness.    All other systems reviewed and negative.    Physical Exam: Pulse 88, height 5\' 7"  (1.702 m), weight 140 lb (63.5 kg), SpO2 96 %. General: Well developed, well nourished female in no acute distress. Head: Normocephalic, atraumatic, sclera non-icteric, no xanthomas,  nares are without discharge. EENT: normal  Lymph Nodes:  none Neck: Negative for carotid bruits. JVD not elevated. Back:without scoliosis kyphosis Lungs: Clear bilaterally to auscultation without wheezes, rales, or rhonchi. Breathing is unlabored. Heart: RRR with S1 S2. No  murmur . No rubs, or gallops appreciated. Abdomen: Soft, non-tender, non-distended with normoactive bowel sounds. No hepatomegaly. No rebound/guarding. No obvious abdominal masses. Msk:  Strength and tone appear normal for age. Extremities: No clubbing or cyanosis. No edema.  Distal pedal pulses  are 2+ and equal bilaterally. Skin: Warm and Dry; multiple tattoos Neuro: Alert and oriented X 3. CN III-XII intact Grossly normal sensory and motor function . Psych:  Responds to questions appropriately with a normal affect.      Labs: Cardiac Enzymes No results for input(s): CKTOTAL, CKMB, TROPONINI in the last 72 hours. CBC No results found for: WBC, HGB, HCT, MCV, PLT PROTIME: No results for input(s): LABPROT, INR in the last 72 hours. Chemistry No results for input(s): NA, K, CL, CO2, BUN, CREATININE, CALCIUM, PROT, BILITOT, ALKPHOS, ALT, AST, GLUCOSE in the last 168 hours.  Invalid input(s): LABALBU Lipids No results found for: CHOL, HDL, LDLCALC, TRIG BNP No results found for: PROBNP Thyroid Function Tests: No results for input(s): TSH, T4TOTAL, T3FREE, THYROIDAB in the last 72 hours.  Invalid input(s): FREET3 Miscellaneous No results found for: DDIMER  Radiology/Studies:  No results found.  EKG: sinus 88 02/11/36   Event recorder personally reviewed.  Multiple episodes of lightheadedness and presyncope were associated with sinus rhythm with rates below 100.  There is an episode of what appears to be sinus tachycardia at 170 bpm.  All tachycardia had upright P waves with a long RP configuration.  Assessment and Plan:  SVT probably sinus tachycardia no evidence of primary arrhythmia  Ehlers-Danlos  syndrome question type previously type III question reclassification  Major depression   It is not clear that her SVT is a cardiac arrhythmia.  She has been treated with atenolol with her impression of benefit.  It is reasonable to continue.    Her event recorder suggests sinus rhythm at variable rates; no evidence of a primary arrhythmia.  Her symptoms of tachypalpitations and presyncope were associated largely with sinus rhythm at rates less than 100.  These may be vasomotor.  She does have some tachycardia with rates in the 160-170 range.  This was asymptomatic.  Her orthostatic vital signs are not consistent with POTS.  She has the data from [facility] which might be more helpful.  Abnormality of her vital signs may be attenuated by beta-blockers.  Orthostatic hypotension may be blunted by the use of ProAmatine.  We have reviewed its dosing and she will take it not twice daily but 5 mg every 4 hours while awake.  She struggles with issues of loss and acknowledges major depression.  She is actively cared for by psychiatry.  Encouraged water exercise   Suggested that it is important that she have a specific diagnosis of her Ehlers-Danlos syndrome on the context of the new diagnoses scheme and that she seek medical expertise to help with her variant.  I gave her the name of Dr. Peggye Form at [facility].  She has a Arts administrator as noted above at [facility]. With More than 50% of 80 min was spent in counseling related to the above    Marisa Harvey

## 2017-12-27 NOTE — Patient Instructions (Signed)
Medication Instructions:  Your physician has recommended you make the following change in your medication:   1. Start taking your ProAmatine 5mg , three times per day.  Labwork: None ordered.  Testing/Procedures: None ordered.  Follow-Up: Your physician recommends that you schedule a follow-up appointment in:   6 months with Dr Graciela Husbands   Any Other Special Instructions Will Be Listed Below (If Applicable).     If you need a refill on your cardiac medications before your next appointment, please call your pharmacy.

## 2017-12-31 ENCOUNTER — Encounter: Payer: Self-pay | Admitting: Osteopathic Medicine

## 2018-01-01 ENCOUNTER — Encounter: Payer: Self-pay | Admitting: Osteopathic Medicine

## 2018-01-01 ENCOUNTER — Other Ambulatory Visit: Payer: Self-pay | Admitting: Osteopathic Medicine

## 2018-01-02 DIAGNOSIS — K0889 Other specified disorders of teeth and supporting structures: Secondary | ICD-10-CM | POA: Diagnosis not present

## 2018-01-02 DIAGNOSIS — K047 Periapical abscess without sinus: Secondary | ICD-10-CM | POA: Diagnosis not present

## 2018-01-02 DIAGNOSIS — Q796 Ehlers-Danlos syndrome, unspecified: Secondary | ICD-10-CM | POA: Diagnosis not present

## 2018-01-02 DIAGNOSIS — G629 Polyneuropathy, unspecified: Secondary | ICD-10-CM | POA: Diagnosis not present

## 2018-01-02 DIAGNOSIS — Z79899 Other long term (current) drug therapy: Secondary | ICD-10-CM | POA: Diagnosis not present

## 2018-01-02 MED ORDER — AMOXICILLIN 500 MG PO CAPS: 1000.0000 mg | ORAL_CAPSULE | Freq: Two times a day (BID) | ORAL | 0 refills | Status: DC

## 2018-01-02 NOTE — Telephone Encounter (Signed)
No refills on antibiotics

## 2018-01-02 NOTE — Telephone Encounter (Signed)
[facility] pharmacy requesting med refill for amoxicillin. Pls advise if appropriate, thanks.

## 2018-01-02 NOTE — Telephone Encounter (Signed)
As per pt, Dr. Denyse Amass refilled antibiotic. Pt currently has a severe toothache, she has an appt for 01/22/18 with dentist. She did not want to wait until dental appt for treatment.

## 2018-01-02 NOTE — Addendum Note (Signed)
Addended by: Deirdre Pippins on: 01/02/2018 05:03 PM   Modules accepted: Orders

## 2018-01-02 NOTE — Telephone Encounter (Signed)
Ok to refill then but she needs to see dentist - if antibiotics are not helping the toothache, an infection may not be the real cause of her pain

## 2018-01-02 NOTE — Telephone Encounter (Signed)
Pt has been updated.  

## 2018-01-10 ENCOUNTER — Ambulatory Visit (HOSPITAL_COMMUNITY): Payer: Medicare Other | Admitting: Licensed Clinical Social Worker

## 2018-01-28 ENCOUNTER — Ambulatory Visit (HOSPITAL_COMMUNITY): Payer: Medicare Other | Admitting: Licensed Clinical Social Worker

## 2018-02-04 DIAGNOSIS — Z79899 Other long term (current) drug therapy: Secondary | ICD-10-CM | POA: Diagnosis not present

## 2018-02-04 DIAGNOSIS — Q7962 Hypermobile Ehlers-Danlos syndrome: Secondary | ICD-10-CM | POA: Diagnosis not present

## 2018-02-04 DIAGNOSIS — G894 Chronic pain syndrome: Secondary | ICD-10-CM | POA: Diagnosis not present

## 2018-02-11 ENCOUNTER — Ambulatory Visit (HOSPITAL_COMMUNITY): Payer: Medicare Other | Admitting: Licensed Clinical Social Worker

## 2018-02-11 NOTE — Progress Notes (Deleted)
HPI: FU mitral valve prolapse and POTS.  Note history of Ehlers-Danlos syndrome.  [place]  She apparently has had supraventricular tachycardia since age 37.    Also with a long history of POTS. Echo September 2019 showed normal LV function, mild diastolic dysfunction and mild prolapse of posterior mitral valve leaflet.  Monitor October 2019 showed sinus to sinus tachycardia with PVCs.  Patient seen by Dr. Graciela HusbandsKlein and was felt likely to have sinus tachycardia as a cause of her palpitations.  He also felt that her orthostatic vital signs were not consistent with POTS. Since last seen  Current Outpatient Medications  Medication Sig Dispense Refill  . amoxicillin (AMOXIL) 500 MG capsule Take 2 capsules (1,000 mg total) by mouth 2 (two) times daily. 40 capsule 0  . atenolol (TENORMIN) 25 MG tablet Take 25 mg by mouth 2 (two) times daily.    . DULoxetine (CYMBALTA) 30 MG capsule Take 30 mg by mouth at bedtime.    . DULoxetine (CYMBALTA) 60 MG capsule Take 60 mg by mouth at bedtime.    Marland Kitchen. escitalopram (LEXAPRO) 20 MG tablet Take 1 tablet (20 mg total) by mouth daily. 90 tablet 1  . gabapentin (NEURONTIN) 300 MG capsule Take 300 mg by mouth at bedtime.    Marland Kitchen. HYDROcodone-acetaminophen (NORCO) 10-325 MG tablet Take 1 tablet by mouth every 4 (four) hours.    . midodrine (PROAMATINE) 10 MG tablet Take 5 mg by mouth 3 (three) times daily.    . traMADol (ULTRAM) 50 MG tablet Take 1-2 tablets (50-100 mg total) by mouth every 6 (six) hours as needed. 180 tablet 0   No current facility-administered medications for this visit.      Past Medical History:  Diagnosis Date  . Anxiety   . Carpal tunnel syndrome   . Chronic fatigue syndrome with fibromyalgia 08/07/2017  . Controlled substance agreement signed 08/21/2017   signed 08/21/17 see scanned docs   . Depression 2001  . Ehlers-Danlos syndrome type III 08/07/2017  . Fibromyalgia 01/2017  . History of mitral valve prolapse 08/07/2017  .  Nephrolithiasis   . Neuropathy   . Osteopenia   . Persistent depressive disorder 09/18/2017  . POTS (postural orthostatic tachycardia syndrome)    with blackouts and tremors  . PTSD (post-traumatic stress disorder)   . Supraventricular tachycardia (HCC)   . SVT (supraventricular tachycardia) (HCC) 08/07/2017    Past Surgical History:  Procedure Laterality Date  . ABDOMINOPLASTY    . BREAST ENHANCEMENT SURGERY    . CESAREAN SECTION     2 pregnancies  . HERNIA REPAIR      Social History   Socioeconomic History  . Marital status: Single    Spouse name: Not on file  . Number of children: 2  . Years of education: Not on file  . Highest education level: Not on file  Occupational History  . Occupation: disabled  Social Needs  . Financial resource strain: Not on file  . Food insecurity:    Worry: Not on file    Inability: Not on file  . Transportation needs:    Medical: Not on file    Non-medical: Not on file  Tobacco Use  . Smoking status: Former Smoker    Packs/day: 1.00    Years: 10.00    Pack years: 10.00    Last attempt to quit: 11/05/2014    Years since quitting: 3.2  . Smokeless tobacco: Never Used  Substance and Sexual Activity  .  Alcohol use: Not Currently    Comment: rare, few times per year   . Drug use: Never  . Sexual activity: Yes    Partners: Male    Birth control/protection: Condom    Comment: married  Lifestyle  . Physical activity:    Days per week: Not on file    Minutes per session: Not on file  . Stress: Not on file  Relationships  . Social connections:    Talks on phone: Not on file    Gets together: Not on file    Attends religious service: Not on file    Active member of club or organization: Not on file    Attends meetings of clubs or organizations: Not on file    Relationship status: Not on file  . Intimate partner violence:    Fear of current or ex partner: Not on file    Emotionally abused: Not on file    Physically abused: Not on  file    Forced sexual activity: Not on file  Other Topics Concern  . Not on file  Social History Narrative  . Not on file    Family History  Problem Relation Age of Onset  . Ehlers-Danlos syndrome Mother   . Ehlers-Danlos syndrome Maternal Grandmother   . Behavior problems Sister     ROS: no fevers or chills, productive cough, hemoptysis, dysphasia, odynophagia, melena, hematochezia, dysuria, hematuria, rash, seizure activity, orthopnea, PND, pedal edema, claudication. Remaining systems are negative.  Physical Exam: Well-developed well-nourished in no acute distress.  Skin is warm and dry.  HEENT is normal.  Neck is supple.  Chest is clear to auscultation with normal expansion.  Cardiovascular exam is regular rate and rhythm.  Abdominal exam nontender or distended. No masses palpated. Extremities show no edema. neuro grossly intact  ECG- personally reviewed  A/P  1  Olga Millers, MD

## 2018-02-14 ENCOUNTER — Ambulatory Visit (INDEPENDENT_AMBULATORY_CARE_PROVIDER_SITE_OTHER): Payer: Medicare Other | Admitting: Licensed Clinical Social Worker

## 2018-02-14 DIAGNOSIS — F4310 Post-traumatic stress disorder, unspecified: Secondary | ICD-10-CM

## 2018-02-14 DIAGNOSIS — F4010 Social phobia, unspecified: Secondary | ICD-10-CM | POA: Diagnosis not present

## 2018-02-14 DIAGNOSIS — F341 Dysthymic disorder: Secondary | ICD-10-CM | POA: Diagnosis not present

## 2018-02-14 NOTE — Progress Notes (Signed)
   THERAPIST PROGRESS NOTE  Session Time: 11:38am-12:32pm  Participation Level: Active  Behavioral Response: DisheveledAlertAnxious  Type of Therapy: Individual Therapy  Treatment Goals addressed: Increase comfort with participating in therapy  Interventions: Assessment, treatment planning  Suicidal/Homicidal: Chronic SI without intent or plan, denied HI  Therapist Interventions: Gathered information about reasons why patient has returned for therapy after several months.  Had patient complete a PHQ9 and GAD7 to assess for severity of symptoms of depression and anxiety.   Collaborated with patient to develop her treatment plan.  Educated her about treatment options for PTSD.  Emphasized that her first goal needs to be to feel safe in the therapy environment.        Summary:  Patient admitted she avoids leaving the house most of the time and this is part of the reason why it has taken so long to return for treatment.   Symptoms of depression and anxiety have remained severe: [facility] 02/14/2018 09/18/2017 08/07/2017  Decreased Interest 2 3 3   Down, Depressed, Hopeless 3 3 3   PHQ - 2 Score 5 6 6   Altered sleeping 3 3 3   Tired, decreased energy 3 3 3   Change in appetite 3 3 3   Feeling bad or failure about yourself  3 3 3   Trouble concentrating 2 3 3   Moving slowly or fidgety/restless 2 0 0  Suicidal thoughts 3 1 -  PHQ-9 Score 24 22 21   Difficult doing work/chores - - Extremely dIfficult   GAD 7 : Generalized Anxiety Score 02/14/2018 09/18/2017 08/07/2017  Nervous, Anxious, on Edge 3 2 1   Control/stop worrying 3 2 1   Worry too much - different things 3 2 3   Trouble relaxing 3 2 3   Restless 3 2 1   Easily annoyed or irritable 3 2 3   Afraid - awful might happen 3 2 3   Total GAD 7 Score 21 14 15   Anxiety Difficulty Extremely difficult Extremely difficult Somewhat difficult   Identified many concerns: having excessive anxiety when riding in the car, being easily  startled, having violent thoughts when she hears someone chewing a particular way, acting on compulsions to check that her doors are locked multiple times each evening, etc Indicated she liked the idea of working on reducing her anxiety about participating in therapy.       Plan: Recommending therapy once a week.  Next time may explores interests. Treatment plan review is due 05/16/18     Diagnosis: PTSD                        Persistent Depressive Disorder                       Social Anxiety disorder                             Darrin LuisSolomon, Sarah A, LCSW 02/14/2018

## 2018-02-20 ENCOUNTER — Ambulatory Visit: Payer: Self-pay | Admitting: Cardiology

## 2018-03-04 ENCOUNTER — Ambulatory Visit (HOSPITAL_COMMUNITY): Payer: Medicare Other | Admitting: Licensed Clinical Social Worker

## 2018-03-21 ENCOUNTER — Telehealth: Payer: Self-pay | Admitting: Osteopathic Medicine

## 2018-03-21 NOTE — Telephone Encounter (Signed)
Patient called to cancel and reschedule appointment

## 2018-03-22 ENCOUNTER — Encounter: Payer: Self-pay | Admitting: Osteopathic Medicine

## 2018-04-01 DIAGNOSIS — Z79899 Other long term (current) drug therapy: Secondary | ICD-10-CM | POA: Diagnosis not present

## 2018-04-01 DIAGNOSIS — G894 Chronic pain syndrome: Secondary | ICD-10-CM | POA: Diagnosis not present

## 2018-04-01 DIAGNOSIS — Q7962 Hypermobile Ehlers-Danlos syndrome: Secondary | ICD-10-CM | POA: Diagnosis not present

## 2018-04-02 ENCOUNTER — Encounter: Payer: Self-pay | Admitting: Osteopathic Medicine

## 2018-04-02 ENCOUNTER — Telehealth: Payer: Self-pay | Admitting: Osteopathic Medicine

## 2018-04-02 NOTE — Telephone Encounter (Signed)
Patient left a voicemail stating that she had a rough night and would have to reschedule this appointment. No further questions at this time.

## 2018-04-09 ENCOUNTER — Encounter: Payer: Medicare Other | Admitting: Osteopathic Medicine

## 2018-04-09 ENCOUNTER — Emergency Department (INDEPENDENT_AMBULATORY_CARE_PROVIDER_SITE_OTHER)
Admission: EM | Admit: 2018-04-09 | Discharge: 2018-04-09 | Disposition: A | Discharge status: Home / Self Care | Payer: Medicare Other | Source: Home / Self Care

## 2018-04-09 ENCOUNTER — Other Ambulatory Visit: Payer: Self-pay

## 2018-04-09 DIAGNOSIS — N644 Mastodynia: Secondary | ICD-10-CM

## 2018-04-09 DIAGNOSIS — N6320 Unspecified lump in the left breast, unspecified quadrant: Secondary | ICD-10-CM

## 2018-04-09 NOTE — Discharge Instructions (Signed)
°  An order for a mammogram as well as a breast ultrasound (if indicated based off mammogram results) have been placed.  You should be contacted by the breast center tomorrow, if not, please call to schedule an appointment to have imaging completed. Then, you should follow up with Dr. Lyn Hollingshead for continued care and discussion of imaging results.

## 2018-04-09 NOTE — ED Provider Notes (Signed)
[facility]    CSN: [id number] Arrival date & time: 04/09/18  1337     History   Chief Complaint Chief Complaint  Patient presents with  . Breast Pain    implants    HPI Marisa Harvey is a 38 y.o. female.   HPI  Marisa Harvey is a 38 y.o. female presenting to UC with c/o gradually worsening painful breast mass on the medial aspect of her Left breast for "a few months."  Pt had breast implants placed in [place] in 2006 or 2008.  She called her surgeon who recommended she get imaging. He had a benign cyst on her Right breast several years ago, when she lived in [place]. She denies other symptoms including fever, chills, weight loss or nipple discharge.    Past Medical History:  Diagnosis Date  . Anxiety   . Carpal tunnel syndrome   . Chronic fatigue syndrome with fibromyalgia 08/07/2017  . Controlled substance agreement signed 08/21/2017   signed 08/21/17 see scanned docs   . Depression 2001  . Ehlers-Danlos syndrome type III 08/07/2017  . Fibromyalgia 01/2017  . History of mitral valve prolapse 08/07/2017  . Nephrolithiasis   . Neuropathy   . Osteopenia   . Persistent depressive disorder 09/18/2017  . POTS (postural orthostatic tachycardia syndrome)    with blackouts and tremors  . PTSD (post-traumatic stress disorder)   . Supraventricular tachycardia (HCC)   . SVT (supraventricular tachycardia) (HCC) 08/07/2017    Patient Active Problem List   Diagnosis Date Noted  . Fibromyalgia 09/18/2017  . Persistent depressive disorder 09/18/2017  . Controlled substance agreement signed 08/21/2017  . Ehlers-Danlos syndrome type III 08/07/2017  . POTS (postural orthostatic tachycardia syndrome) 08/07/2017  . SVT (supraventricular tachycardia) (HCC) 08/07/2017  . History of mitral valve prolapse 08/07/2017  . Neuropathy 08/07/2017  . Chronic fatigue syndrome with fibromyalgia 08/07/2017  . PTSD (post-traumatic stress disorder) 08/07/2017  . Depression with anxiety  08/07/2017  . Osteopenia 08/07/2017    Past Surgical History:  Procedure Laterality Date  . ABDOMINOPLASTY    . BREAST ENHANCEMENT SURGERY    . CESAREAN SECTION     2 pregnancies  . HERNIA REPAIR      OB History   No obstetric history on file.      Home Medications    Prior to Admission medications   Medication Sig Start Date End Date Taking? Authorizing Provider  HYDROcodone-acetaminophen (NORCO) 10-325 MG tablet Take 1 tablet by mouth every 6 (six) hours as needed.   Yes [provider]  amoxicillin (AMOXIL) 500 MG capsule Take 2 capsules (1,000 mg total) by mouth 2 (two) times daily. 01/02/18   Sunnie Nielsen, DO  atenolol (TENORMIN) 25 MG tablet Take 25 mg by mouth 2 (two) times daily.    [provider]  DULoxetine (CYMBALTA) 30 MG capsule Take 30 mg by mouth at bedtime.    [provider]  DULoxetine (CYMBALTA) 60 MG capsule Take 60 mg by mouth at bedtime.    [provider]  escitalopram (LEXAPRO) 20 MG tablet Take 1 tablet (20 mg total) by mouth daily. 12/07/17   Sunnie Nielsen, DO  gabapentin (NEURONTIN) 300 MG capsule Take 300 mg by mouth at bedtime.    [provider]  midodrine (PROAMATINE) 10 MG tablet Take 5 mg by mouth 3 (three) times daily.    [provider]  traMADol (ULTRAM) 50 MG tablet Take 1-2 tablets (50-100 mg total) by mouth every 6 (six) hours as  needed. 10/01/17   Sunnie Nielsen, DO    Family History Family History  Problem Relation Age of Onset  . Ehlers-Danlos syndrome Mother   . Ehlers-Danlos syndrome Maternal Grandmother   . Behavior problems Sister     Social History Social History   Tobacco Use  . Smoking status: Former Smoker    Packs/day: 1.00    Years: 10.00    Pack years: 10.00    Last attempt to quit: 11/05/2014    Years since quitting: 3.4  . Smokeless tobacco: Never Used  Substance Use Topics  . Alcohol use: Not Currently    Comment: rare, few times per year     . Drug use: Never     Allergies   Patient has no known allergies.   Review of Systems Review of Systems  Cardiovascular: Positive for chest pain (Left breast). Negative for palpitations.  Gastrointestinal: Negative for nausea and vomiting.  Musculoskeletal: Negative for myalgias.  Skin: Negative for rash and wound.     Physical Exam Triage Vital Signs ED Triage Vitals [04/09/18 1404]  Enc Vitals Group     BP 117/77     Pulse Rate 92     Resp      Temp 98.2 F (36.8 C)     Temp Source Oral     SpO2 99 %     Weight 147 lb (66.7 kg)     Height 5\' 7"  (1.702 m)     Head Circumference      Peak Flow      Pain Score 6     Pain Loc      Pain Edu?      Excl. in GC?    No data found.  Updated Vital Signs BP 117/77 (BP Location: Right Arm)   Pulse 92   Temp 98.2 F (36.8 C) (Oral)   Ht 5\' 7"  (1.702 m)   Wt 147 lb (66.7 kg)   LMP 04/09/2018 (Exact Date)   SpO2 99%   BMI 23.02 kg/m   Visual Acuity Right Eye Distance:   Left Eye Distance:   Bilateral Distance:    Right Eye Near:   Left Eye Near:    Bilateral Near:     Physical Exam Vitals signs and nursing note reviewed.  Constitutional:      Appearance: She is well-developed.  HENT:     Head: Normocephalic and atraumatic.  Neck:     Musculoskeletal: Normal range of motion.  Cardiovascular:     Rate and Rhythm: Normal rate.  Pulmonary:     Effort: Pulmonary effort is normal.  Chest:     Breasts:        Left: Mass and tenderness present. No inverted nipple, nipple discharge or skin change.       Comments: 2x4cm tender mass. Skin in tact. No ecchymosis or erythema.  Musculoskeletal: Normal range of motion.  Skin:    General: Skin is warm and dry.  Neurological:     Mental Status: She is alert and oriented to person, place, and time.  Psychiatric:        Behavior: Behavior normal.      UC Treatments / Results  Labs (all labs ordered are listed, but only abnormal results are displayed) Labs  Reviewed - No data to display  EKG None  Radiology No results found.  Procedures Procedures (including critical care time)  Medications Ordered in UC Medications - No data to display  Initial Impression / Assessment and Plan /  UC Course  I have reviewed the triage vital signs and the nursing notes.  Pertinent labs & imaging results that were available during my care of the patient were reviewed by me and considered in my medical decision making (see chart for details).    No evidence of cellulitis or abscess at this time. Will hold off on antibiotics. Mammogram and U/S ordered placed for pt to receive [facility] this week or next week. Encouraged f/u with her PCP for ongoing care.  Pt agreeable with tx plan.   Final Clinical Impressions(s) / UC Diagnoses   Final diagnoses:  Breast pain, left  Left breast mass     Discharge Instructions      An order for a mammogram as well as a breast ultrasound (if indicated based off mammogram results) have been placed.  You should be contacted by the breast center tomorrow, if not, please call to schedule an appointment to have imaging completed. Then, you should follow up with Dr. Lyn HollingsheadAlexander for continued care and discussion of imaging results.     ED Prescriptions    None     Controlled Substance Prescriptions [place] Controlled Substance Registry consulted? Not Applicable   Rolla Platehelps, Leigha Olberding O, PA-C 04/09/18 1452

## 2018-04-09 NOTE — ED Triage Notes (Signed)
Pt c/o pain in her breast implants that started 2 mos ago which she thinks is due to her connective tissue disorder. She got the implants in 2006 or 2008 in [place]. She contacted the surgeon who recommended she get some imaging done on to confirm the reason for her pain but she is not sure what kind of imaging he wanted. (she said she assumes MRI)

## 2018-04-15 ENCOUNTER — Ambulatory Visit: Payer: Self-pay | Admitting: Osteopathic Medicine

## 2018-04-17 ENCOUNTER — Other Ambulatory Visit: Payer: Self-pay | Admitting: Emergency Medicine

## 2018-04-17 DIAGNOSIS — N644 Mastodynia: Secondary | ICD-10-CM

## 2018-04-23 ENCOUNTER — Ambulatory Visit: Payer: Medicare Other | Admitting: Osteopathic Medicine

## 2018-04-24 ENCOUNTER — Other Ambulatory Visit: Payer: Self-pay

## 2018-04-29 ENCOUNTER — Ambulatory Visit
Admission: RE | Admit: 2018-04-29 | Discharge: 2018-04-29 | Disposition: A | Discharge status: Home / Self Care | Payer: Medicare Other | Source: Ambulatory Visit | Attending: Emergency Medicine | Admitting: Emergency Medicine

## 2018-04-29 DIAGNOSIS — N644 Mastodynia: Secondary | ICD-10-CM | POA: Diagnosis not present

## 2018-04-29 DIAGNOSIS — R928 Other abnormal and inconclusive findings on diagnostic imaging of breast: Secondary | ICD-10-CM | POA: Diagnosis not present

## 2018-05-06 ENCOUNTER — Ambulatory Visit (INDEPENDENT_AMBULATORY_CARE_PROVIDER_SITE_OTHER): Payer: Medicare Other | Admitting: Osteopathic Medicine

## 2018-05-06 ENCOUNTER — Encounter: Payer: Self-pay | Admitting: Osteopathic Medicine

## 2018-05-06 VITALS — BP 108/71 | HR 98 | Temp 99.1°F | Wt 139.4 lb

## 2018-05-06 DIAGNOSIS — M797 Fibromyalgia: Secondary | ICD-10-CM

## 2018-05-06 DIAGNOSIS — Q7962 Hypermobile Ehlers-Danlos syndrome: Secondary | ICD-10-CM | POA: Diagnosis not present

## 2018-05-06 DIAGNOSIS — R5382 Chronic fatigue, unspecified: Secondary | ICD-10-CM | POA: Diagnosis not present

## 2018-05-06 DIAGNOSIS — F418 Other specified anxiety disorders: Secondary | ICD-10-CM

## 2018-05-06 MED ORDER — VORTIOXETINE HBR 10 MG PO TABS: ORAL_TABLET | ORAL | 0 refills | Status: DC

## 2018-05-06 NOTE — Patient Instructions (Addendum)
Will refer to different counselor - Shanda Bumps is here two days a week, we will have another person starting later this month who will be here the other three days a week.   Starting Trintellix: will get the Rx to pharmacy, this will probably have to go through an authorization process w/ insurance which can take a couple weeks at most.   Once you have the Trintellix: Week 1: Cymbalta 60 mg ONLY (instead of 60 mg and 30 mg) + Lexapro 10 mg (instead of 20 mg, can cut the tablets in half) Week 2: Cymbalta 30 mg ONLY + Trintellix 10 mg (1 tablet) Week 3: Trintellix 20 mg (2 tablets)   Week 4: come see me!   As always, for immediate mental health services if needed,  [facility], [address] [place], [place] [address], [phone]  [facility], [address] [place], [place] [address], [phone]  Any emergency room  National Suicide Prevention Lifeline, 706-737-3448

## 2018-05-06 NOTE — Progress Notes (Signed)
HPI: Marisa Harvey is a 38 y.o. female who  has a past medical history of Anxiety, Carpal tunnel syndrome, Chronic fatigue syndrome with fibromyalgia (08/07/2017), Controlled substance agreement signed (08/21/2017), Depression (2001), Ehlers-Danlos syndrome type III (08/07/2017), Fibromyalgia (01/2017), History of mitral valve prolapse (08/07/2017), Nephrolithiasis, Neuropathy, Osteopenia, Persistent depressive disorder (09/18/2017), POTS (postural orthostatic tachycardia syndrome), PTSD (post-traumatic stress disorder), Supraventricular tachycardia (HCC), and SVT (supraventricular tachycardia) (HCC) (08/07/2017).  she presents to [facility] today, 05/06/18,  for chief complaint of:  Follow-up L breast pain, seen by Urgent care last month for same  Depression   Breast concern . Location: medial L breast  . Quality: UC notes mention 2x4 cm tender mass on L medial breast  . Duration: few months  . Context: s/p breast augmentation surgery 2006 or 2008, hx benign cyst on R breast few years ago. Surgery consult later this week, concern for leaking in breast implant . Mammogram and ultrasound of the left breast on 04/29/2018 showed glandular tissue in the area of concern, no discrete mass or distortion.  Nothing to suggest malignancy. L implant is deflated.  Recommended screening mammogram in 1 year.  Depression  Would like to try different meds  Current: Cymbalta 90 mg + Lexapro 20 mg, helps more than nothing but she reports significant depression persistent symptoms  Previous meds: Zoloft, Paxil, Prozac, Seroquel all caused "feeling like a zombie," emotionally numb. Wellbutrin caused heart issues, not specified.       At today's visit 05/06/18 ... PMH, PSH, FH reviewed and updated as needed.  Current medication list and allergy/intolerance hx reviewed and updated as needed. (See remainder of HPI, ROS, Phys Exam below)           ASSESSMENT/PLAN: The  primary encounter diagnosis was Depression with anxiety. Diagnoses of Chronic fatigue syndrome with fibromyalgia and Ehlers-Danlos syndrome type III were also pertinent to this visit.   Orders Placed This Encounter  Procedures  . Ambulatory referral to Psychology     Meds ordered this encounter  Medications  . vortioxetine HBr (TRINTELLIX) 10 MG TABS tablet    Sig: Take 1 tablet (10 mg total) by mouth daily for 7 days, THEN 2 tablets (20 mg total) daily for 14 days.    Dispense:  30 tablet    Refill:  0    Patient Instructions  Will refer to different counselor - Shanda Bumps is here two days a week, we will have another person starting later this month who will be here the other three days a week.   Starting Trintellix: will get the Rx to pharmacy, this will probably have to go through an authorization process w/ insurance which can take a couple weeks at most.   Once you have the Trintellix: Week 1: Cymbalta 60 mg ONLY (instead of 60 mg and 30 mg) + Lexapro 10 mg (instead of 20 mg, can cut the tablets in half) Week 2: Cymbalta 30 mg ONLY + Trintellix 10 mg (1 tablet) Week 3: Trintellix 20 mg (2 tablets)   Week 4: come see me!   As always, for immediate mental health services if needed,  [facility], [address], [place], [place] [address], [phone]  [facility], [address], [place], [place] [address], [phone]  Any emergency room  National Suicide Prevention Lifeline, 613-571-6756      Follow-up plan: Return in about 4 weeks (around 06/03/2018) for recheck on medications for depression, see me soonter if  needed.                                                 ################################################# ################################################# ################################################# #################################################    Current Meds  Medication Sig  . atenolol (TENORMIN) 25 MG tablet Take 25 mg by mouth 2 (two) times daily.  . DULoxetine (CYMBALTA) 30 MG capsule Take 30 mg by mouth at bedtime.  . DULoxetine (CYMBALTA) 60 MG capsule Take 60 mg by mouth at bedtime.  Marland Kitchen escitalopram (LEXAPRO) 20 MG tablet Take 1 tablet (20 mg total) by mouth daily.  Marland Kitchen gabapentin (NEURONTIN) 300 MG capsule Take 300 mg by mouth at bedtime.  Marland Kitchen HYDROcodone-acetaminophen (NORCO) 10-325 MG tablet Take 1 tablet by mouth every 6 (six) hours as needed.  . midodrine (PROAMATINE) 10 MG tablet Take 5 mg by mouth 3 (three) times daily.  . traMADol (ULTRAM) 50 MG tablet Take 1-2 tablets (50-100 mg total) by mouth every 6 (six) hours as needed.    No Known Allergies     Review of Systems:  Constitutional: No recent illness  HEENT: No  headache, no vision change  Cardiac: No  chest pain, No  pressure, No palpitations  Respiratory:  No  shortness of breath. No  Cough  Gastrointestinal: No  abdominal pain  Skin: No  Rash  Hem/Onc: No  easy bruising/bleeding  Psychiatric: +concerns with depression, +concerns with anxiety  Exam:  BP 108/71 (BP Location: Left Arm, Patient Position: Sitting, Cuff Size: Normal)   Pulse 98   Temp 99.1 F (37.3 C) (Oral)   Wt 139 lb 6.4 oz (63.2 kg)   LMP 04/09/2018 (Exact Date)   BMI 21.83 kg/m   Constitutional: VS see above. General Appearance: alert, well-developed, well-nourished, NAD  Eyes: Normal lids and conjunctive, non-icteric sclera  Ears, Nose, Mouth, Throat: MMM, Normal external inspection ears/nares/mouth/lips/gums.  Neck: No masses, trachea midline.   Respiratory:  Normal respiratory effort.   Musculoskeletal: Gait normal. Symmetric and independent movement of all extremities  Neurological: Normal balance/coordination. No tremor.  Skin: warm, dry, intact.   Psychiatric: Normal judgment/insight. Normal mood and affect. Oriented x3.       Visit summary with medication list and pertinent instructions was printed for patient to review, patient was advised to alert [place] if any updates are needed. All questions at time of visit were answered - patient instructed to contact office with any additional concerns. ER/RTC precautions were reviewed with the patient and understanding verbalized.   Note: Total time spent 25 minutes, greater than 50% of the visit was spent face-to-face counseling and coordinating care for the following: The primary encounter diagnosis was Depression with anxiety. Diagnoses of Chronic fatigue syndrome with fibromyalgia and Ehlers-Danlos syndrome type III were also pertinent to this visit.Marland Kitchen  Please note: voice recognition software was used to produce this document, and typos may escape review. Please contact Dr. Lyn Hollingshead for any needed clarifications.    Follow up plan: Return in about 4 weeks (around 06/03/2018) for recheck on medications for depression, see me soonter if needed.

## 2018-05-09 ENCOUNTER — Encounter: Payer: Self-pay | Admitting: Osteopathic Medicine

## 2018-05-27 DIAGNOSIS — Q7962 Hypermobile Ehlers-Danlos syndrome: Secondary | ICD-10-CM | POA: Diagnosis not present

## 2018-05-27 DIAGNOSIS — Z79899 Other long term (current) drug therapy: Secondary | ICD-10-CM | POA: Diagnosis not present

## 2018-05-27 DIAGNOSIS — G894 Chronic pain syndrome: Secondary | ICD-10-CM | POA: Diagnosis not present

## 2018-05-30 ENCOUNTER — Telehealth: Payer: Self-pay | Admitting: Osteopathic Medicine

## 2018-05-30 MED ORDER — VORTIOXETINE HBR 20 MG PO TABS: 20.0000 mg | ORAL_TABLET | Freq: Every day | ORAL | 3 refills | Status: AC

## 2018-05-30 NOTE — Telephone Encounter (Signed)
Cymbalta and Lexapro removed from list Refilled Trintellix 20 mg, take 1 daily . 90 days supply sent. Glad it's working!

## 2018-05-30 NOTE — Telephone Encounter (Signed)
Routing to provider  

## 2018-05-30 NOTE — Telephone Encounter (Signed)
Pt stopped lexapro and cymbalta. Pt states mood more stable and no adverse reactions. Pt states she likes TRINTELLIX. Pt request refill sent to [facility] on [address] in [place].  Pt states had a twenty one day supply; she took one per day for seven days then two per day after that and two per day is great.

## 2018-05-30 NOTE — Telephone Encounter (Signed)
Pt has been updated. Aware of updated rx for Trintellix. No other inquiries during call.

## 2018-06-01 ENCOUNTER — Encounter: Payer: Self-pay | Admitting: Osteopathic Medicine

## 2018-06-01 DIAGNOSIS — I471 Supraventricular tachycardia: Secondary | ICD-10-CM

## 2018-06-03 ENCOUNTER — Ambulatory Visit: Payer: Self-pay | Admitting: Osteopathic Medicine

## 2018-06-03 MED ORDER — ATENOLOL 25 MG PO TABS: 25.0000 mg | ORAL_TABLET | Freq: Two times a day (BID) | ORAL | 3 refills | Status: DC

## 2018-06-14 ENCOUNTER — Encounter: Payer: Self-pay | Admitting: Osteopathic Medicine

## 2018-07-01 ENCOUNTER — Ambulatory Visit: Payer: Self-pay | Admitting: Osteopathic Medicine

## 2018-07-02 ENCOUNTER — Ambulatory Visit: Payer: Medicaid Other | Admitting: Psychology

## 2018-07-08 ENCOUNTER — Telehealth: Payer: Self-pay | Admitting: Internal Medicine

## 2018-07-08 NOTE — Telephone Encounter (Signed)
New message   Spoke w/pt. Scheduled DOXY.ME video visit with Dr. Graciela Husbands. Pt smart phone number is listed in the appt notes.      Virtual Visit Pre-Appointment Phone Call  "(Name), I am calling you today to discuss your upcoming appointment. We are currently trying to limit exposure to the virus that causes COVID-19 by seeing patients at home rather than in the office."  1. "What is the BEST phone number to call the day of the visit?" - include this in appointment notes  2. Do you have or have access to (through a family member/friend) a smartphone with video capability that we can use for your visit?" a. If yes - list this number in appt notes as cell (if different from BEST phone #) and list the appointment type as a VIDEO visit in appointment notes b. If no - list the appointment type as a PHONE visit in appointment notes  3. Confirm consent - "In the setting of the current Covid19 crisis, you are scheduled for a (phone or video) visit with your provider on (date) at (time).  Just as we do with many in-office visits, in order for you to participate in this visit, we must obtain consent.  If you'd like, I can send this to your mychart (if signed up) or email for you to review.  Otherwise, I can obtain your verbal consent now.  All virtual visits are billed to your insurance company just like a normal visit would be.  By agreeing to a virtual visit, we'd like you to understand that the technology does not allow for your provider to perform an examination, and thus may limit your provider's ability to fully assess your condition. If your provider identifies any concerns that need to be evaluated in person, we will make arrangements to do so.  Finally, though the technology is pretty good, we cannot assure that it will always work on either your or our end, and in the setting of a video visit, we may have to convert it to a phone-only visit.  In either situation, we cannot ensure that we have a  secure connection.  Are you willing to proceed?" STAFF: Did the patient verbally acknowledge consent to telehealth visit? Document YES/NO here: YES  4. Advise patient to be prepared - "Two hours prior to your appointment, go ahead and check your blood pressure, pulse, oxygen saturation, and your weight (if you have the equipment to check those) and write them all down. When your visit starts, your provider will ask you for this information. If you have an Apple Watch or Kardia device, please plan to have heart rate information ready on the day of your appointment. Please have a pen and paper handy nearby the day of the visit as well."  5. Give patient instructions for MyChart download to smartphone OR Doximity/Doxy.me as below if video visit (depending on what platform provider is using)  6. Inform patient they will receive a phone call 15 minutes prior to their appointment time (may be from unknown caller ID) so they should be prepared to answer    TELEPHONE CALL NOTE  Marisa Harvey has been deemed a candidate for a follow-up tele-health visit to limit community exposure during the Covid-19 pandemic. I spoke with the patient via phone to ensure availability of phone/video source, confirm preferred email & phone number, and discuss instructions and expectations.  I reminded Marisa Section to be prepared with any vital sign and/or heart rhythm information that could  potentially be obtained via home monitoring, at the time of her visit. I reminded Marisa Harvey to expect a phone call prior to her visit.  Marisa Harvey 07/08/2018 2:39 PM   INSTRUCTIONS FOR DOWNLOADING THE MYCHART APP TO SMARTPHONE  - The patient must first make sure to have activated MyChart and know their login information - If Apple, go to [employer] Store and type in MyChart in the search bar and download the app. If Android, ask patient to go to [employer] Play Store and type in [place] [facility] in the search bar and download the app. The app  is free but as with any other app downloads, their phone may require them to verify saved payment information or Apple/Android password.  - The patient will need to then log into the app with their MyChart username and password, and select [facility] as their healthcare provider to link the account. When it is time for your visit, go to the MyChart app, find appointments, and click Begin Video Visit. Be sure to Select Allow for your device to access the Microphone and Camera for your visit. You will then be connected, and your provider will be with you shortly.  **If they have any issues connecting, or need assistance please contact MyChart service desk [phone])**  **If using a computer, in order to ensure the best quality for their visit they will need to use either of the following Internet Browsers: [employer], or Google Chrome**  IF USING DOXIMITY or DOXY.ME - The patient will receive a link just prior to their visit by text.     FULL LENGTH CONSENT FOR TELE-HEALTH VISIT   I hereby voluntarily request, consent and authorize [facility] and its employed or contracted physicians, physician assistants, nurse practitioners or other licensed health care professionals (the Practitioner), to provide me with telemedicine health care services (the Services") as deemed necessary by the treating Practitioner. I acknowledge and consent to receive the Services by the Practitioner via telemedicine. I understand that the telemedicine visit will involve communicating with the Practitioner through live audiovisual communication technology and the disclosure of certain medical information by electronic transmission. I acknowledge that I have been given the opportunity to request an in-person assessment or other available alternative prior to the telemedicine visit and am voluntarily participating in the telemedicine visit.  I understand that I have the right to withhold or withdraw my  consent to the use of telemedicine in the course of my care at any time, without affecting my right to future care or treatment, and that the Practitioner or I may terminate the telemedicine visit at any time. I understand that I have the right to inspect all information obtained and/or recorded in the course of the telemedicine visit and may receive copies of available information for a reasonable fee.  I understand that some of the potential risks of receiving the Services via telemedicine include:   Delay or interruption in medical evaluation due to technological equipment failure or disruption;  Information transmitted may not be sufficient (e.g. poor resolution of images) to allow for appropriate medical decision making by the Practitioner; and/or   In rare instances, security protocols could fail, causing a breach of personal health information.  Furthermore, I acknowledge that it is my responsibility to provide information about my medical history, conditions and care that is complete and accurate to the best of my ability. I acknowledge that Practitioner's advice, recommendations, and/or decision may be based on factors not within their control,  such as incomplete or inaccurate data provided by me or distortions of diagnostic images or specimens that may result from electronic transmissions. I understand that the practice of medicine is not an exact science and that Practitioner makes no warranties or guarantees regarding treatment outcomes. I acknowledge that I will receive a copy of this consent concurrently upon execution via email to the email address I last provided but may also request a printed copy by calling the office of [facility].    I understand that my insurance will be billed for this visit.   I have read or had this consent read to me.  I understand the contents of this consent, which adequately explains the benefits and risks of the Services being provided via telemedicine.    I have been provided ample opportunity to ask questions regarding this consent and the Services and have had my questions answered to my satisfaction.  I give my informed consent for the services to be provided through the use of telemedicine in my medical care  By participating in this telemedicine visit I agree to the above.

## 2018-07-10 ENCOUNTER — Encounter: Payer: Self-pay | Admitting: Internal Medicine

## 2018-07-10 ENCOUNTER — Telehealth (INDEPENDENT_AMBULATORY_CARE_PROVIDER_SITE_OTHER): Payer: Medicare Other | Admitting: Internal Medicine

## 2018-07-10 ENCOUNTER — Other Ambulatory Visit: Payer: Self-pay

## 2018-07-10 VITALS — Ht 67.0 in | Wt 138.0 lb

## 2018-07-10 DIAGNOSIS — I471 Supraventricular tachycardia: Secondary | ICD-10-CM

## 2018-07-10 DIAGNOSIS — G90A Postural orthostatic tachycardia syndrome (POTS): Secondary | ICD-10-CM

## 2018-07-10 DIAGNOSIS — Q796 Ehlers-Danlos syndrome, unspecified: Secondary | ICD-10-CM

## 2018-07-10 NOTE — Progress Notes (Signed)
Electrophysiology TeleHealth Note   Due to national recommendations of social distancing due to COVID 19, an audio/video telehealth visit is felt to be most appropriate for this patient at this time.  See MyChart message from today for the patient's consent [facility].   Date:  07/10/2018   ID:  Marisa Harvey, DOB 07/18/1980, MRN [id number]  Location: patient's home  Provider location: [address], [place]  Evaluation Performed: Follow-up visit  PCP:  Sunnie NielsenAlexander, Natalie, DO  Cardiologist:    Electrophysiologist:  SK   Chief Complaint: dizziness  History of Present Illness:    Marisa Harvey is a 38 y.o. female who presents via audio/video conferencing for a telehealth visit today.  Since last being seen in our clinic for tachypalpitations  the patient reports ongoing issues  Reviewed history-- shower and orthostatic intolerance and recurrent syncope with residual OI     The patient denies symptoms of fevers, chills, cough, or new SOB worrisome for COVID 19.    Past Medical History:  Diagnosis Date  . Anxiety   . Carpal tunnel syndrome   . Chronic fatigue syndrome with fibromyalgia 08/07/2017  . Controlled substance agreement signed 08/21/2017   signed 08/21/17 see scanned docs   . Depression 2001  . Ehlers-Danlos syndrome type III 08/07/2017  . Fibromyalgia 01/2017  . History of mitral valve prolapse 08/07/2017  . Nephrolithiasis   . Neuropathy   . Osteopenia   . Persistent depressive disorder 09/18/2017  . POTS (postural orthostatic tachycardia syndrome)    with blackouts and tremors  . PTSD (post-traumatic stress disorder)   . Supraventricular tachycardia (HCC)   . SVT (supraventricular tachycardia) (HCC) 08/07/2017    Past Surgical History:  Procedure Laterality Date  . ABDOMINOPLASTY    . BREAST ENHANCEMENT SURGERY    . CESAREAN SECTION     2 pregnancies  . HERNIA REPAIR      Current Outpatient Medications  Medication Sig  Dispense Refill  . atenolol (TENORMIN) 25 MG tablet Take 1 tablet (25 mg total) by mouth 2 (two) times daily. 180 tablet 3  . gabapentin (NEURONTIN) 300 MG capsule Take 300 mg by mouth at bedtime.    Marland Kitchen. HYDROcodone-acetaminophen (NORCO) 10-325 MG tablet Take 1 tablet by mouth every 6 (six) hours as needed.    . midodrine (PROAMATINE) 10 MG tablet Take 10 mg by mouth 2 (two) times a day.    . traMADol (ULTRAM) 50 MG tablet Take 1-2 tablets (50-100 mg total) by mouth every 6 (six) hours as needed. 180 tablet 0  . vortioxetine HBr (TRINTELLIX) 20 MG TABS tablet Take 1 tablet (20 mg total) by mouth daily. 90 tablet 3   No current facility-administered medications for this visit.     Allergies:   Patient has no known allergies.   Social History:  The patient  reports that she quit smoking about 3 years ago. She has a 10.00 pack-year smoking history. She has never used smokeless tobacco. She reports previous alcohol use. She reports that she does not use drugs.   Family History:  The patient's   family history includes Behavior problems in her sister; Ehlers-Danlos syndrome in her maternal grandmother and mother.   ROS:  Please see the history of present illness.   All other systems are personally reviewed and negative.    Exam:    Vital Signs:  Ht 5\' 7"  (1.702 m)   Wt 138 lb (62.6 kg)   BMI 21.61  kg/m     Well appearing, alert and conversant, regular work of breathing,  good skin color Eyes- anicteric, neuro- grossly intact, skin- no apparent rash or lesions or cyanosis, mouth- oral mucosa is pink   Labs/Other Tests and Data Reviewed:    Recent Labs: No results found for requested labs within last 8760 hours.   Wt Readings from Last 3 Encounters:  07/10/18 138 lb (62.6 kg)  05/06/18 139 lb 6.4 oz (63.2 kg)  04/09/18 147 lb (66.7 kg)     Other studies personally reviewed: Additional studies/ records that were reviewed today include: As above       ASSESSMENT & PLAN:    SVT-presumed to be probably sinus  Ehlers-Danlos-joint laxity/hypermobility  Depression*  Chronic pain    We discussed extensively the issues of dysautonomia, the physiology of orthstasis and positional stress.  We discussed the role of salt and water repletion, the importance of exercise, often needing to be started in the recumbent position, and the awareness of triggers and the role of ambient heat and dehydration.  Discussed compressive wear and the possibility of trying flodrocortisone   She would like to hold on meds for now   Suggested nighttime shower   CHOP-POTS - exercise recommended    COVID 19 screen The patient denies symptoms of COVID 19 at this time.  The importance of social distancing was discussed today.  Follow-up:  24m    Current medicines are reviewed at length with the patient today.   The patient does not have concerns regarding her medicines.  The following changes were made today:  none  Labs/ tests ordered today include:   No orders of the defined types were placed in this encounter.   Future tests ( post COVID )     Patient Risk:  after full review of this patients clinical status, I feel that they are at moderate  risk at this time.  Today, I have spent 20  minutes with the patient with telehealth technology discussing the above.  Signed, Sherryl Manges, MD  07/10/2018 2:56 PM     [facility] [address] [place] [address] [phone] (office) [phone] (fax)

## 2018-08-02 DIAGNOSIS — Q7962 Hypermobile Ehlers-Danlos syndrome: Secondary | ICD-10-CM | POA: Diagnosis not present

## 2018-08-02 DIAGNOSIS — G894 Chronic pain syndrome: Secondary | ICD-10-CM | POA: Diagnosis not present

## 2018-09-09 ENCOUNTER — Telehealth: Payer: Self-pay | Admitting: Plastic Surgery

## 2018-09-09 NOTE — Telephone Encounter (Signed)

## 2018-09-10 ENCOUNTER — Ambulatory Visit (INDEPENDENT_AMBULATORY_CARE_PROVIDER_SITE_OTHER): Payer: Self-pay | Admitting: Plastic Surgery

## 2018-09-10 ENCOUNTER — Encounter: Payer: Self-pay | Admitting: Plastic Surgery

## 2018-09-10 ENCOUNTER — Other Ambulatory Visit: Payer: Self-pay

## 2018-09-10 VITALS — BP 137/80 | HR 60 | Temp 98.7°F | Ht 67.0 in | Wt 135.0 lb

## 2018-09-10 DIAGNOSIS — Q7962 Hypermobile Ehlers-Danlos syndrome: Secondary | ICD-10-CM | POA: Diagnosis not present

## 2018-09-10 DIAGNOSIS — T8543XA Leakage of breast prosthesis and implant, initial encounter: Secondary | ICD-10-CM | POA: Insufficient documentation

## 2018-09-10 DIAGNOSIS — G894 Chronic pain syndrome: Secondary | ICD-10-CM | POA: Diagnosis not present

## 2018-09-10 DIAGNOSIS — Z5181 Encounter for therapeutic drug level monitoring: Secondary | ICD-10-CM | POA: Diagnosis not present

## 2018-09-10 DIAGNOSIS — Z79899 Other long term (current) drug therapy: Secondary | ICD-10-CM | POA: Diagnosis not present

## 2018-09-10 NOTE — Progress Notes (Signed)
Patient ID: Marisa Harvey, female    DOB: 06/03/1980, 38 y.o.   MRN: [id number]   Chief Complaint  Patient presents with  . Breast Problem    The patient is a 38 year old white female here for evaluation of her breast implants.  She underwent McGhan 68HP 600 cc saline implants in 2006.  [place]  She also had a abdominoplasty at the same time.  Prior to the implants she thinks she was a B/C bra size and is now DD.  She would like to be a C/D cup.  She would also like a mastopexy.  She had a mammogram with an ultrasound in February 2020.  She is 5 feet 7 inches tall and weighs 135 pounds her incision is at the inframammary fold she also has a diagnosed Ehler Danlos syndrome which she state does not inhibit her from healing poorly.  She has severe ptosis and loss of upper pole fullness.   Review of Systems  Constitutional: Negative.  Negative for activity change and appetite change.  HENT: Negative.   Eyes: Negative.   Respiratory: Negative.  Negative for chest tightness and shortness of breath.   Cardiovascular: Negative.  Negative for leg swelling.  Gastrointestinal: Negative.  Negative for abdominal pain.  Endocrine: Negative.   Genitourinary: Negative.   Musculoskeletal: Negative.  Negative for joint swelling.  Neurological: Negative.   Hematological: Negative.   Psychiatric/Behavioral: Negative.     Past Medical History:  Diagnosis Date  . Anxiety   . Carpal tunnel syndrome   . Chronic fatigue syndrome with fibromyalgia 08/07/2017  . Controlled substance agreement signed 08/21/2017   signed 08/21/17 see scanned docs   . Depression 2001  . Ehlers-Danlos syndrome type III 08/07/2017  . Fibromyalgia 01/2017  . History of mitral valve prolapse 08/07/2017  . Nephrolithiasis   . Neuropathy   . Osteopenia   . Persistent depressive disorder 09/18/2017  . POTS (postural orthostatic tachycardia syndrome)    with blackouts and tremors  . PTSD (post-traumatic stress  disorder)   . Supraventricular tachycardia (HCC)   . SVT (supraventricular tachycardia) (HCC) 08/07/2017    Past Surgical History:  Procedure Laterality Date  . ABDOMINOPLASTY    . BREAST ENHANCEMENT SURGERY    . CESAREAN SECTION     2 pregnancies  . HERNIA REPAIR        Current Outpatient Medications:  .  vortioxetine HBr (TRINTELLIX) 20 MG TABS tablet, Take 20 mg by mouth daily., Disp: , Rfl:  .  atenolol (TENORMIN) 25 MG tablet, Take 1 tablet (25 mg total) by mouth 2 (two) times daily., Disp: 180 tablet, Rfl: 3 .  gabapentin (NEURONTIN) 300 MG capsule, Take 300 mg by mouth at bedtime., Disp: , Rfl:  .  HYDROcodone-acetaminophen (NORCO) 10-325 MG tablet, Take 1 tablet by mouth every 6 (six) hours as needed., Disp: , Rfl:  .  midodrine (PROAMATINE) 10 MG tablet, Take 10 mg by mouth 2 (two) times a day., Disp: , Rfl:  .  traMADol (ULTRAM) 50 MG tablet, Take 1-2 tablets (50-100 mg total) by mouth every 6 (six) hours as needed., Disp: 180 tablet, Rfl: 0   Objective:   Vitals:   09/10/18 1435  BP: 137/80  Pulse: 60  Temp: 98.7 F (37.1 C)  SpO2: 99%    Physical Exam Vitals signs and nursing note reviewed.  Constitutional:      Appearance: Normal appearance.  HENT:     Head: Normocephalic and atraumatic.  Eyes:     Extraocular Movements: Extraocular movements intact.     Pupils: Pupils are equal, round, and reactive to light.  Cardiovascular:     Rate and Rhythm: Normal rate.     Pulses: Normal pulses.  Pulmonary:     Effort: Pulmonary effort is normal. No respiratory distress.  Abdominal:     General: Abdomen is flat. There is no distension.     Tenderness: There is no abdominal tenderness.  Neurological:     General: No focal deficit present.     Mental Status: She is alert. Mental status is at baseline.  Psychiatric:        Mood and Affect: Mood normal.        Behavior: Behavior normal.        Thought Content: Thought content normal.     Assessment & Plan:      ICD-10-CM   1. Ehlers-Danlos syndrome type III  Q79.62   2. Rupture of implant of left breast, initial encounter  T85.43XA   Recommend bilateral removal of implants with placement of new smaller implants.  Most likely in the 400cc range with mastopexy.  Patient was given a quote. Pictures were obtained of the patient and placed in the chart with the patient's or guardian's permission.  I strongly recommend doing smaller implants due to her Drue Dun and likelihood of ptosis in the future.  [place], DO

## 2018-09-30 ENCOUNTER — Encounter: Payer: Self-pay | Admitting: Osteopathic Medicine

## 2018-10-02 ENCOUNTER — Encounter: Payer: Self-pay | Admitting: Osteopathic Medicine

## 2018-10-02 ENCOUNTER — Ambulatory Visit (INDEPENDENT_AMBULATORY_CARE_PROVIDER_SITE_OTHER): Payer: Medicare Other | Admitting: Osteopathic Medicine

## 2018-10-02 VITALS — Ht 67.0 in | Wt 136.0 lb

## 2018-10-02 DIAGNOSIS — F332 Major depressive disorder, recurrent severe without psychotic features: Secondary | ICD-10-CM

## 2018-10-02 MED ORDER — ARIPIPRAZOLE 10 MG PO TABS: ORAL_TABLET | ORAL | 0 refills | Status: DC

## 2018-10-02 MED ORDER — ARIPIPRAZOLE 20 MG PO TABS: 20.0000 mg | ORAL_TABLET | Freq: Every day | ORAL | 0 refills | Status: DC

## 2018-10-02 NOTE — Patient Instructions (Signed)
For immediate mental health services:  [facility], [address] [place], [place] [address], [facility], [address] [place],  [address], [phone]  Any emergency room  National Suicide [facility], [phone]   Try the Abilify! Will start at 10 mg, if issues with this can back down to half tablet / 5 mg daily for a week then try going up again. If 10 mg is ok, can increase to 20 mg second week, and we will touch base then to see how you're doing. Call me sooner if needed!

## 2018-10-02 NOTE — Addendum Note (Signed)
Addended by: Maryla Morrow on: 10/02/2018 05:15 PM   Modules accepted: Orders

## 2018-10-02 NOTE — Progress Notes (Signed)
Virtual Visit via Video (App used: Doximity) Note  I connected with      Marisa Harvey on 10/02/18 at 1:20PM by a telemedicine application and verified that I am speaking with the correct person using two identifiers.  Patient is at home I am in office   I discussed the limitations of evaluation and management by telemedicine and the availability of in person appointments. The patient expressed understanding and agreed to proceed.  History of Present Illness: Marisa Harvey is a 38 y.o. female who would like to discuss depression  Worsening over past few weeks. Previously on Zoloft, Celexa, Wellbutrin, maybe Elavil but not sure. Trintellix was working well until recently. Not sure why but much more sad, easily upset, feeling down. NO thoughts of hurting self/others, (+)passive death wish on occasion.        Depression screen [facility] 2/9 10/02/2018 08/07/2017  Decreased Interest 3 3  Down, Depressed, Hopeless 3 3  PHQ - 2 Score 6 6  Altered sleeping 2 3  Tired, decreased energy 3 3  Change in appetite 2 3  Feeling bad or failure about yourself  3 3  Trouble concentrating 2 3  Moving slowly or fidgety/restless 1 0  Suicidal thoughts 3 -  PHQ-9 Score 22 21  Difficult doing work/chores Extremely dIfficult Extremely dIfficult  Some encounter information is confidential and restricted. Go to Review Flowsheets activity to see all data.   GAD 7 : Generalized Anxiety Score 10/02/2018 08/07/2017  Nervous, Anxious, on Edge 1 1  Control/stop worrying 1 1  Worry too much - different things 1 3  Trouble relaxing 1 3  Restless 1 1  Easily annoyed or irritable 1 3  Afraid - awful might happen 2 3  Total GAD 7 Score 8 15  Anxiety Difficulty Somewhat difficult Somewhat difficult  Some encounter information is confidential and restricted. Go to Review Flowsheets activity to see all data.       Observations/Objective: Ht 5\' 7"  (1.702 m)   Wt 136 lb (61.7 kg)   BMI 21.30 kg/m  BP  Readings from Last 3 Encounters:  09/10/18 137/80  05/06/18 108/71  04/09/18 117/77   Exam: Normal Speech.  NAD  Lab and Radiology Results No results found for this or any previous visit (from the past 72 hour(s)). No results found.     Assessment and Plan: 38 y.o. female with There were no encounter diagnoses.   PDMP not reviewed this encounter. No orders of the defined types were placed in this encounter.  Meds ordered this encounter  Medications  . ARIPiprazole (ABILIFY) 10 MG tablet    Sig: Take 1 tablet (10 mg total) by mouth daily for 7 days, THEN 2 tablets (20 mg total) daily for 7 days.    Dispense:  30 tablet    Refill:  0   Patient Instructions  For immediate mental health services:  [facility], [address] [place], [place] [address], [facility], [address] [place], [place] [address], [phone]  Any emergency room  National Suicide [facility], [phone]   Try the Abilify! Will start at 10 mg, if issues with this can back down to half tablet / 5 mg daily for a week then try going up again. If 10 mg is ok, can increase to 20 mg second week, and we will touch base then to see how you're doing. Call me sooner if needed!    Instructions sent via  MyChart. If MyChart not available, pt was given option for info via personal e-mail w/ no guarantee of protected health info over unsecured e-mail communication, and MyChart sign-up instructions were included.   Follow Up Instructions: Return in about 2 weeks (around 10/16/2018) for RECHECK ON NEW MEDICATION: ABILIFY (VIRTUAL VISIT) .    I discussed the assessment and treatment plan with the patient. The patient was provided an opportunity to ask questions and all were answered. The patient agreed with the plan and demonstrated an understanding of the instructions.   The patient was advised to call back or seek an in-person  evaluation if any new concerns, if symptoms worsen or if the condition fails to improve as anticipated.  25 minutes of non-face-to-face time was provided during this encounter.                      Historical information moved to improve visibility of documentation.  Past Medical History:  Diagnosis Date  . Anxiety   . Carpal tunnel syndrome   . Chronic fatigue syndrome with fibromyalgia 08/07/2017  . Controlled substance agreement signed 08/21/2017   signed 08/21/17 see scanned docs   . Depression 2001  . Ehlers-Danlos syndrome type III 08/07/2017  . Fibromyalgia 01/2017  . History of mitral valve prolapse 08/07/2017  . Nephrolithiasis   . Neuropathy   . Osteopenia   . Persistent depressive disorder 09/18/2017  . POTS (postural orthostatic tachycardia syndrome)    with blackouts and tremors  . PTSD (post-traumatic stress disorder)   . Supraventricular tachycardia (HCC)   . SVT (supraventricular tachycardia) (HCC) 08/07/2017   Past Surgical History:  Procedure Laterality Date  . ABDOMINOPLASTY    . BREAST ENHANCEMENT SURGERY    . CESAREAN SECTION     2 pregnancies  . HERNIA REPAIR     Social History   Tobacco Use  . Smoking status: Former Smoker    Packs/day: 1.00    Years: 10.00    Pack years: 10.00    Quit date: 11/05/2014    Years since quitting: 3.9  . Smokeless tobacco: Never Used  Substance Use Topics  . Alcohol use: Not Currently    Comment: rare, few times per year    family history includes Behavior problems in her sister; Ehlers-Danlos syndrome in her maternal grandmother and mother.  Medications: Current Outpatient Medications  Medication Sig Dispense Refill  . atenolol (TENORMIN) 25 MG tablet Take 1 tablet (25 mg total) by mouth 2 (two) times daily. 180 tablet 3  . gabapentin (NEURONTIN) 300 MG capsule Take 300 mg by mouth at bedtime.    Marland Kitchen. HYDROcodone-acetaminophen (NORCO) 10-325 MG tablet Take 1 tablet by mouth every 4 (four) hours as  needed.     . midodrine (PROAMATINE) 10 MG tablet Take 10 mg by mouth 2 (two) times a day.    . traMADol (ULTRAM) 50 MG tablet Take 1-2 tablets (50-100 mg total) by mouth every 6 (six) hours as needed. 180 tablet 0  . vortioxetine HBr (TRINTELLIX) 20 MG TABS tablet Take 20 mg by mouth daily.    . ARIPiprazole (ABILIFY) 10 MG tablet Take 1 tablet (10 mg total) by mouth daily for 7 days, THEN 2 tablets (20 mg total) daily for 7 days. 30 tablet 0   No current facility-administered medications for this visit.    No Known Allergies  PDMP not reviewed this encounter. No orders of the defined types were placed in this encounter.  Meds ordered this encounter  Medications  . ARIPiprazole (ABILIFY) 10 MG tablet    Sig: Take 1 tablet (10 mg total) by mouth daily for 7 days, THEN 2 tablets (20 mg total) daily for 7 days.    Dispense:  30 tablet    Refill:  0

## 2018-10-31 ENCOUNTER — Other Ambulatory Visit: Payer: Self-pay | Admitting: Osteopathic Medicine

## 2018-10-31 NOTE — Telephone Encounter (Signed)
Requested medication (s) are due for refill today: yes  Requested medication (s) are on the active medication list: yes  Last refill:  10/02/2018  Future visit scheduled: no  Notes to clinic: This refill cannot be delegated   Requested Prescriptions  Pending Prescriptions Disp Refills   ARIPiprazole (ABILIFY) 20 MG tablet [Pharmacy Med Name: ARIPIPRAZOLE 20MG  (TWENTY MG) TABS] 30 tablet 0    Sig: TAKE 1 TABLET(20 MG) BY MOUTH DAILY     Not Delegated - Psychiatry:  Antipsychotics - Second Generation (Atypical) - aripiprazole Failed - 10/31/2018 12:55 PM      Failed - This refill cannot be delegated      Passed - Valid encounter within last 6 months    Recent Outpatient Visits          4 weeks ago Severe episode of recurrent major depressive disorder, without psychotic features ([place])   [facility], Lanelle Bal, DO   5 months ago Depression with anxiety   [facility], Lanelle Bal, DO   1 year ago Ehlers-Danlos syndrome type III   [facility], Lanelle Bal, DO   1 year ago Ehlers-Danlos syndrome type III   [facility], Lanelle Bal, DO   1 year ago Ehlers-Danlos syndrome type III   [facility], [place], DO

## 2018-11-07 ENCOUNTER — Ambulatory Visit (INDEPENDENT_AMBULATORY_CARE_PROVIDER_SITE_OTHER): Payer: Medicare Other | Admitting: Osteopathic Medicine

## 2018-11-07 ENCOUNTER — Telehealth: Payer: Self-pay | Admitting: Osteopathic Medicine

## 2018-11-07 DIAGNOSIS — Z0289 Encounter for other administrative examinations: Secondary | ICD-10-CM

## 2018-11-07 NOTE — Progress Notes (Signed)
Called pt at 1045 am & 11 am. No answer, left multiple vm msgs.

## 2018-11-07 NOTE — Telephone Encounter (Addendum)
Patient states she thinks she is pregnant. She took 4 pregnancy test and all 4 test were positive and she is on about 7 different medications. Patient states she is scared and does not know what is safe or not so she stopped taking everything. I transferred call to triage. Patient did miss appointment for today and is aware of that. Something happened with her phone.

## 2018-11-07 NOTE — Telephone Encounter (Signed)
Would NOT take the midodrine  Would probably stop the gabapentin as well Everything else should be ok at least for now  Can reschedule visit to discuss further (can double book last slot tomorrow if needed)

## 2018-11-07 NOTE — Telephone Encounter (Signed)
Called patient twice and left voicemail with information below. No answer from patient.

## 2018-11-07 NOTE — Telephone Encounter (Signed)
Appointment has been made. No further questions at this time.  

## 2018-11-12 ENCOUNTER — Ambulatory Visit (INDEPENDENT_AMBULATORY_CARE_PROVIDER_SITE_OTHER): Payer: Medicare Other | Admitting: Osteopathic Medicine

## 2018-11-12 ENCOUNTER — Encounter: Payer: Self-pay | Admitting: Osteopathic Medicine

## 2018-11-12 ENCOUNTER — Other Ambulatory Visit: Payer: Self-pay

## 2018-11-12 VITALS — BP 118/74 | HR 82 | Temp 99.6°F | Wt 143.8 lb

## 2018-11-12 DIAGNOSIS — Z3201 Encounter for pregnancy test, result positive: Secondary | ICD-10-CM | POA: Diagnosis not present

## 2018-11-12 DIAGNOSIS — N926 Irregular menstruation, unspecified: Secondary | ICD-10-CM | POA: Diagnosis not present

## 2018-11-12 DIAGNOSIS — O0991 Supervision of high risk pregnancy, unspecified, first trimester: Secondary | ICD-10-CM

## 2018-11-12 LAB — POCT URINE PREGNANCY: Preg Test, Ur: POSITIVE — AB

## 2018-11-12 NOTE — Progress Notes (Signed)
HPI: Marisa Harvey is a 38 y.o. female who  has a past medical history of Anxiety, Carpal tunnel syndrome, Chronic fatigue syndrome with fibromyalgia (08/07/2017), Controlled substance agreement signed (08/21/2017), Depression (2001), Ehlers-Danlos syndrome type III (08/07/2017), Fibromyalgia (01/2017), History of mitral valve prolapse (08/07/2017), Nephrolithiasis, Neuropathy, Osteopenia, Persistent depressive disorder (09/18/2017), POTS (postural orthostatic tachycardia syndrome), PTSD (post-traumatic stress disorder), Supraventricular tachycardia ([place]), and SVT (supraventricular tachycardia) ([place]) (08/07/2017).  she presents to [facility], 11/12/18,  for chief complaint of:  Pregnancy   Recently found out she was pregnant. Happy about this and has good family support though she is nervous about her age and medications, Ehler's Danlos, hx preeclampsia.    At today's visit 11/12/18 ... PMH, PSH, FH reviewed and updated as needed.  Current medication list and allergy/intolerance hx reviewed and updated as needed. (See remainder of HPI, ROS, Phys Exam below)   No results found.  Results for orders placed or performed in visit on 11/12/18 (from the past 72 hour(s))  POCT urine pregnancy     Status: Abnormal   Collection Time: 11/12/18  7:50 AM  Result Value Ref Range   Preg Test, Ur Positive (A) Negative          ASSESSMENT/PLAN: The primary encounter diagnosis was High-risk pregnancy in first trimester. Diagnoses of Missed periods and Pregnancy, confirmed, not first were also pertinent to this visit.   Discussed risks versus benefits of current medications.   Would definitely take her off the midodrine, probably gabapentin is not ideal either.    I think for now okay to continue chronic pain medications though may consider backing off on the dosage, patient has an upcoming appointment with pain management.    I think with the Trintellix &  Abilify, she states that mentally she feels a great deal better on this combination and is very reluctant to come off of these medicines.  May consider decreased dose particularly in third trimester but for now I think the benefits of the medications outweigh the risks.  Orders Placed This Encounter  Procedures  . POCT urine pregnancy     No orders of the defined types were placed in this encounter.   There are no Patient Instructions on file for this visit.    Follow-up plan: Return for check-up as needed during pregnancy, otherwise see me for annual when you're postpartum! .                                                 ################################################# ################################################# ################################################# #################################################    Current Meds  Medication Sig  . ARIPiprazole (ABILIFY) 20 MG tablet TAKE 1 TABLET(20 MG) BY MOUTH DAILY  . atenolol (TENORMIN) 25 MG tablet Take 1 tablet (25 mg total) by mouth 2 (two) times daily.  Marland Kitchen HYDROcodone-acetaminophen (NORCO) 10-325 MG tablet Take 1 tablet by mouth every 4 (four) hours as needed.   . traMADol (ULTRAM) 50 MG tablet Take 1-2 tablets (50-100 mg total) by mouth every 6 (six) hours as needed.  . vortioxetine HBr (TRINTELLIX) 20 MG TABS tablet Take 20 mg by mouth daily.    No Known Allergies     Review of Systems:  Constitutional: No recent illness  HEENT: No  headache, no vision change  Musculoskeletal: No new myalgia/arthralgia  GU: no abnormal discharge, no bleeding   Psychiatric:  No  concerns with depression, No  concerns with anxiety  Exam:  BP 118/74 (BP Location: Left Arm, Patient Position: Sitting, Cuff Size: Normal)   Pulse 82   Temp 99.6 F (37.6 C) (Oral)   Wt 143 lb 12.8 oz (65.2 kg)   LMP 10/10/2018   BMI 22.52 kg/m   Constitutional: VS see above. General  Appearance: alert, well-developed, well-nourished, NAD  Respiratory: Normal respiratory effort.  Musculoskeletal: Gait normal. Symmetric and independent movement of all extremities  Neurological: Normal balance/coordination. No tremor.  Skin: warm, dry, intact.   Psychiatric: Normal judgment/insight. Normal mood and affect. Oriented x3.       Visit summary with medication list and pertinent instructions was printed for patient to review, patient was advised to alert us if any updates are needed. All questions at time of visit were answered - patient instructed to contact office with any additional concerns. ER/RTC precautions were reviewed with the patient and understanding verbalized.   Note: Total time spent 25 minutes, greater than 50% of the visit was spent face-to-face counseling and coordinating care for the following: The primary encounter diagnosis was High-risk pregnancy in first trimester. Diagnoses of Missed periods and Pregnancy, confirmed, not first were also pertinent to this visit.Marland Kitchen.  Please note: voice recognition software was used to produce this document, and typos may escape review. Please contact Dr. Lyn HollingsheadAlexander for any needed clarifications.    Follow up plan: Return for check-up as needed during pregnancy, otherwise see me for annual when you're postpartum! .Marland Kitchen

## 2018-11-27 ENCOUNTER — Telehealth: Payer: Self-pay

## 2018-11-27 NOTE — Telephone Encounter (Signed)
Pt called stating she has her new ob appt on 12/16/18. Pt c/o one occasion of light spotting. PT states she went to the bathroom and when she wiped she saw a little bit of light pink discharge on toilet paper. Pt denies recent intercourse. I explained to pt that spotting can occur during pregnancy but if the bleeding becomes heavier and she is experiencing cramping then she should go to [facility] to be evaluated. Pt expressed understanding.

## 2018-12-03 DIAGNOSIS — G894 Chronic pain syndrome: Secondary | ICD-10-CM | POA: Diagnosis not present

## 2018-12-03 DIAGNOSIS — Q7962 Hypermobile Ehlers-Danlos syndrome: Secondary | ICD-10-CM | POA: Diagnosis not present

## 2018-12-03 DIAGNOSIS — Z79899 Other long term (current) drug therapy: Secondary | ICD-10-CM | POA: Diagnosis not present

## 2018-12-16 ENCOUNTER — Ambulatory Visit (HOSPITAL_COMMUNITY): Payer: Medicare Other | Admitting: *Deleted

## 2018-12-16 ENCOUNTER — Other Ambulatory Visit: Payer: Self-pay

## 2018-12-16 ENCOUNTER — Encounter (HOSPITAL_COMMUNITY): Payer: Self-pay

## 2018-12-16 ENCOUNTER — Encounter: Payer: Self-pay | Admitting: Obstetrics & Gynecology

## 2018-12-16 ENCOUNTER — Other Ambulatory Visit (HOSPITAL_COMMUNITY)
Admission: RE | Admit: 2018-12-16 | Discharge: 2018-12-16 | Disposition: A | Discharge status: Home / Self Care | Payer: Medicare Other | Source: Ambulatory Visit | Attending: Obstetrics & Gynecology | Admitting: Obstetrics & Gynecology

## 2018-12-16 ENCOUNTER — Other Ambulatory Visit: Payer: Medicare Other

## 2018-12-16 ENCOUNTER — Ambulatory Visit (HOSPITAL_COMMUNITY)
Admission: RE | Admit: 2018-12-16 | Discharge: 2018-12-16 | Disposition: A | Discharge status: Home / Self Care | Payer: Medicare Other | Source: Ambulatory Visit | Attending: Obstetrics and Gynecology | Admitting: Obstetrics and Gynecology

## 2018-12-16 ENCOUNTER — Other Ambulatory Visit (HOSPITAL_COMMUNITY): Payer: Self-pay | Admitting: Obstetrics & Gynecology

## 2018-12-16 ENCOUNTER — Ambulatory Visit (INDEPENDENT_AMBULATORY_CARE_PROVIDER_SITE_OTHER): Payer: Medicare Other | Admitting: Obstetrics & Gynecology

## 2018-12-16 VITALS — BP 109/67 | HR 76 | Temp 98.5°F

## 2018-12-16 VITALS — BP 111/67 | HR 93 | Wt 144.0 lb

## 2018-12-16 DIAGNOSIS — O3441 Maternal care for other abnormalities of cervix, first trimester: Secondary | ICD-10-CM

## 2018-12-16 DIAGNOSIS — Z1151 Encounter for screening for human papillomavirus (HPV): Secondary | ICD-10-CM | POA: Insufficient documentation

## 2018-12-16 DIAGNOSIS — Z3A09 9 weeks gestation of pregnancy: Secondary | ICD-10-CM

## 2018-12-16 DIAGNOSIS — R87610 Atypical squamous cells of undetermined significance on cytologic smear of cervix (ASC-US): Secondary | ICD-10-CM | POA: Diagnosis not present

## 2018-12-16 DIAGNOSIS — O09521 Supervision of elderly multigravida, first trimester: Secondary | ICD-10-CM | POA: Diagnosis not present

## 2018-12-16 DIAGNOSIS — R300 Dysuria: Secondary | ICD-10-CM | POA: Diagnosis not present

## 2018-12-16 DIAGNOSIS — G90A Postural orthostatic tachycardia syndrome (POTS): Secondary | ICD-10-CM

## 2018-12-16 DIAGNOSIS — Z3480 Encounter for supervision of other normal pregnancy, unspecified trimester: Secondary | ICD-10-CM

## 2018-12-16 DIAGNOSIS — Z8751 Personal history of pre-term labor: Secondary | ICD-10-CM | POA: Insufficient documentation

## 2018-12-16 DIAGNOSIS — Z3481 Encounter for supervision of other normal pregnancy, first trimester: Secondary | ICD-10-CM | POA: Diagnosis not present

## 2018-12-16 DIAGNOSIS — O09529 Supervision of elderly multigravida, unspecified trimester: Secondary | ICD-10-CM

## 2018-12-16 DIAGNOSIS — I498 Other specified cardiac arrhythmias: Secondary | ICD-10-CM | POA: Insufficient documentation

## 2018-12-16 NOTE — Patient Instructions (Signed)
First Trimester of Pregnancy The first trimester of pregnancy is from week 1 until the end of week 13 (months 1 through 3). A week after a sperm fertilizes an egg, the egg will implant on the wall of the uterus. This embryo will begin to develop into a baby. Genes from you and your partner will form the baby. The female genes will determine whether the baby will be a boy or a girl. At 6-8 weeks, the eyes and face will be formed, and the heartbeat can be seen on ultrasound. At the end of 12 weeks, all the baby's organs will be formed. Now that you are pregnant, you will want to do everything you can to have a healthy baby. Two of the most important things are to get good prenatal care and to follow your health care provider's instructions. Prenatal care is all the medical care you receive before the baby's birth. This care will help prevent, find, and treat any problems during the pregnancy and childbirth. Body changes during your first trimester Your body goes through many changes during pregnancy. The changes vary from woman to woman.  You may gain or lose a couple of pounds at first.  You may feel sick to your stomach (nauseous) and you may throw up (vomit). If the vomiting is uncontrollable, call your health care provider.  You may tire easily.  You may develop headaches that can be relieved by medicines. All medicines should be approved by your health care provider.  You may urinate more often. Painful urination may mean you have a bladder infection.  You may develop heartburn as a result of your pregnancy.  You may develop constipation because certain hormones are causing the muscles that push stool through your intestines to slow down.  You may develop hemorrhoids or swollen veins (varicose veins).  Your breasts may begin to grow larger and become tender. Your nipples may stick out more, and the tissue that surrounds them (areola) may become darker.  Your gums may bleed and may be  sensitive to brushing and flossing.  Dark spots or blotches (chloasma, mask of pregnancy) may develop on your face. This will likely fade after the baby is born.  Your menstrual periods will stop.  You may have a loss of appetite.  You may develop cravings for certain kinds of food.  You may have changes in your emotions from day to day, such as being excited to be pregnant or being concerned that something may go wrong with the pregnancy and baby.  You may have more vivid and strange dreams.  You may have changes in your hair. These can include thickening of your hair, rapid growth, and changes in texture. Some women also have hair loss during or after pregnancy, or hair that feels dry or thin. Your hair will most likely return to normal after your baby is born. What to expect at prenatal visits During a routine prenatal visit:  You will be weighed to make sure you and the baby are growing normally.  Your blood pressure will be taken.  Your abdomen will be measured to track your baby's growth.  The fetal heartbeat will be listened to between weeks 10 and 14 of your pregnancy.  Test results from any previous visits will be discussed. Your health care provider may ask you:  How you are feeling.  If you are feeling the baby move.  If you have had any abnormal symptoms, such as leaking fluid, bleeding, severe headaches, or abdominal   cramping.  If you are using any tobacco products, including cigarettes, chewing tobacco, and electronic cigarettes.  If you have any questions. Other tests that may be performed during your first trimester include:  Blood tests to find your blood type and to check for the presence of any previous infections. The tests will also be used to check for low iron levels (anemia) and protein on red blood cells (Rh antibodies). Depending on your risk factors, or if you previously had diabetes during pregnancy, you may have tests to check for high blood sugar  that affects pregnant women (gestational diabetes).  Urine tests to check for infections, diabetes, or protein in the urine.  An ultrasound to confirm the proper growth and development of the baby.  Fetal screens for spinal cord problems (spina bifida) and Down syndrome.  HIV (human immunodeficiency virus) testing. Routine prenatal testing includes screening for HIV, unless you choose not to have this test.  You may need other tests to make sure you and the baby are doing well. Follow these instructions at home: Medicines  Follow your health care provider's instructions regarding medicine use. Specific medicines may be either safe or unsafe to take during pregnancy.  Take a prenatal vitamin that contains at least 600 micrograms (mcg) of folic acid.  If you develop constipation, try taking a stool softener if your health care provider approves. Eating and drinking   Eat a balanced diet that includes fresh fruits and vegetables, whole grains, good sources of protein such as meat, eggs, or tofu, and low-fat dairy. Your health care provider will help you determine the amount of weight gain that is right for you.  Avoid raw meat and uncooked cheese. These carry germs that can cause birth defects in the baby.  Eating four or five small meals rather than three large meals a day may help relieve nausea and vomiting. If you start to feel nauseous, eating a few soda crackers can be helpful. Drinking liquids between meals, instead of during meals, also seems to help ease nausea and vomiting.  Limit foods that are high in fat and processed sugars, such as fried and sweet foods.  To prevent constipation: ? Eat foods that are high in fiber, such as fresh fruits and vegetables, whole grains, and beans. ? Drink enough fluid to keep your urine clear or pale yellow. Activity  Exercise only as directed by your health care provider. Most women can continue their usual exercise routine during  pregnancy. Try to exercise for 30 minutes at least 5 days a week. Exercising will help you: ? Control your weight. ? Stay in shape. ? Be prepared for labor and delivery.  Experiencing pain or cramping in the lower abdomen or lower back is a good sign that you should stop exercising. Check with your health care provider before continuing with normal exercises.  Try to avoid standing for long periods of time. Move your legs often if you must stand in one place for a long time.  Avoid heavy lifting.  Wear low-heeled shoes and practice good posture.  You may continue to have sex unless your health care provider tells you not to. Relieving pain and discomfort  Wear a good support bra to relieve breast tenderness.  Take warm sitz baths to soothe any pain or discomfort caused by hemorrhoids. Use hemorrhoid cream if your health care provider approves.  Rest with your legs elevated if you have leg cramps or low back pain.  If you develop varicose veins in   your legs, wear support hose. Elevate your feet for 15 minutes, 3-4 times a day. Limit salt in your diet. Prenatal care  Schedule your prenatal visits by the twelfth week of pregnancy. They are usually scheduled monthly at first, then more often in the last 2 months before delivery.  Write down your questions. Take them to your prenatal visits.  Keep all your prenatal visits as told by your health care provider. This is important. Safety  Wear your seat belt at all times when driving.  Make a list of emergency phone numbers, including numbers for family, friends, the hospital, and police and fire departments. General instructions  Ask your health care provider for a referral to a local prenatal education class. Begin classes no later than the beginning of month 6 of your pregnancy.  Ask for help if you have counseling or nutritional needs during pregnancy. Your health care provider can offer advice or refer you to specialists for help  with various needs.  Do not use hot tubs, steam rooms, or saunas.  Do not douche or use tampons or scented sanitary pads.  Do not cross your legs for long periods of time.  Avoid cat litter boxes and soil used by cats. These carry germs that can cause birth defects in the baby and possibly loss of the fetus by miscarriage or stillbirth.  Avoid all smoking, herbs, alcohol, and medicines not prescribed by your health care provider. Chemicals in these products affect the formation and growth of the baby.  Do not use any products that contain nicotine or tobacco, such as cigarettes and e-cigarettes. If you need help quitting, ask your health care provider. You may receive counseling support and other resources to help you quit.  Schedule a dentist appointment. At home, brush your teeth with a soft toothbrush and be gentle when you floss. Contact a health care provider if:  You have dizziness.  You have mild pelvic cramps, pelvic pressure, or nagging pain in the abdominal area.  You have persistent nausea, vomiting, or diarrhea.  You have a bad smelling vaginal discharge.  You have pain when you urinate.  You notice increased swelling in your face, hands, legs, or ankles.  You are exposed to fifth disease or chickenpox.  You are exposed to German measles (rubella) and have never had it. Get help right away if:  You have a fever.  You are leaking fluid from your vagina.  You have spotting or bleeding from your vagina.  You have severe abdominal cramping or pain.  You have rapid weight gain or loss.  You vomit blood or material that looks like coffee grounds.  You develop a severe headache.  You have shortness of breath.  You have any kind of trauma, such as from a fall or a car accident. Summary  The first trimester of pregnancy is from week 1 until the end of week 13 (months 1 through 3).  Your body goes through many changes during pregnancy. The changes vary from  woman to woman.  You will have routine prenatal visits. During those visits, your health care provider will examine you, discuss any test results you may have, and talk with you about how you are feeling. This information is not intended to replace advice given to you by your health care provider. Make sure you discuss any questions you have with your health care provider. Document Released: 02/14/2001 Document Revised: 02/02/2017 Document Reviewed: 02/02/2016 Elsevier Patient Education  2020 [employer] Inc.  

## 2018-12-16 NOTE — Progress Notes (Signed)
Subjective:    Marisa Harvey is a [id number] [redacted]w[redacted]d being seen today for her first obstetrical visit.  Her obstetrical history is significant for advanced maternal age, two pretern deliveries, Pre E with second pregnancy, ehleros danlos (hypermobility) and on chronic pain meds, history of anesthetic complication with first pregnancy and endometritis,   Patient reports spotting, cramping..Pt also fatigued  Vitals:   12/16/18 1010  BP: 111/67  Pulse: 93  Weight: 144 lb (65.3 kg)    HISTORY: OB History  Gravida Para Term Preterm AB Living  3 2   2   2   SAB TAB Ectopic Multiple Live Births               # Outcome Date GA Lbr Len/2nd Weight Sex Delivery Anes PTL Lv  3 Current           2 Preterm      CS-LTranv     1 Preterm      CS-LTranv      Past Medical History:  Diagnosis Date  . Anxiety   . Carpal tunnel syndrome   . Chronic fatigue syndrome with fibromyalgia 08/07/2017  . Controlled substance agreement signed 08/21/2017   signed 08/21/17 see scanned docs   . Depression 2001  . Ehlers-Danlos disease   . Ehlers-Danlos syndrome type III 08/07/2017  . Fibromyalgia 01/2017  . History of mitral valve prolapse 08/07/2017  . Nephrolithiasis   . Neuropathy   . Osteopenia   . Persistent depressive disorder 09/18/2017  . POTS (postural orthostatic tachycardia syndrome)    with blackouts and tremors  . PTSD (post-traumatic stress disorder)   . Supraventricular tachycardia (HCC)   . SVT (supraventricular tachycardia) (HCC) 08/07/2017   Past Surgical History:  Procedure Laterality Date  . ABDOMINOPLASTY    . BREAST ENHANCEMENT SURGERY    . CESAREAN SECTION     2 pregnancies  . HERNIA REPAIR     Family History  Problem Relation Age of Onset  . Ehlers-Danlos syndrome Mother   . Ehlers-Danlos syndrome Maternal Grandmother   . Behavior problems Sister      Exam      Uterus:     Pelvic Exam:    Perineum: No Hemorrhoids   Vulva: normal   Vagina:  scant blood   pH: n/a   Cervix: red mass with white tip extruding from cervix.  Infarcted endometrial polyp vs fetal tissue.     Adnexa: not evaluated   Bony Pelvis: average  System: Breast:  left breast implant ruptured   Skin: normal coloration and turgor, no rashes, numerous nevi, largest on right inner thigh (pt states it is stable and been present for years.     Neurologic: oriented, normal mood   Extremities: normal strength, tone, and muscle mass   HEENT sclera clear, anicteric, thyroid without masses and trachea midline   Mouth/Teeth masked   Neck supple and no masses   Cardiovascular: regular rate and rhythm   Respiratory:  appears well, vitals normal, no respiratory distress, acyanotic, normal RR, neck free of mass or lymphadenopathy, chest clear, no wheezing, crepitations, rhonchi, normal symmetric air entry   Abdomen: soft, non-tender; bowel sounds normal; no masses,  no organomegaly   Urinary: urethral meatus normal        Assessment:    Pregnancy: 10/07/2017 Patient Active Problem List   Diagnosis Date Noted  . Ruptured left breast implant 09/10/2018  . Fibromyalgia 09/18/2017  . Persistent depressive disorder 09/18/2017  . Controlled substance agreement signed 08/21/2017  .  Ehlers-Danlos syndrome type III 08/07/2017  . POTS (postural orthostatic tachycardia syndrome) 08/07/2017  . SVT (supraventricular tachycardia) ([place]) 08/07/2017  . History of mitral valve prolapse 08/07/2017  . Neuropathy 08/07/2017  . Chronic fatigue syndrome with fibromyalgia 08/07/2017  . PTSD (post-traumatic stress disorder) 08/07/2017  . Depression with anxiety 08/07/2017  . Osteopenia 08/07/2017        Plan:     Initial labs drawn. Prenatal vitamins. Problem list reviewed and updated.  1.  Cervical mass--MFM to scan and see today.  Will make recommendations based on findings.  Working diagnosis is infarcted endocervical polyp vs POC at os.  IUP on bedside US with FH.  2.  POTS--Pt to follow up with Dr.  Caryl Comes. 3.  Chronic Pain--Pt under a pain contract with MD in [place].  He will manage her medications.  Will need to have NAS consult.   4.  Ehleros Danlos--at risk for cervical incompetence.  Did not have problems with prior two pregnancies.   5.  History of Pre E--Aspirin at 13 weeks 6.  History of PTD x2--offer 17P 7.  Genetic counseling and MFM consult  8.  Depression--continue meds as directed by psychiatrist.   9.  Need records from 1st delivery.  Pt stayed in hospital 14 days due to birth complications.    Silas Sacramento 12/16/2018  Addendum:  Dr. Donalee Citrin scanned pt at MFM.   Cervix Uterus Adnexa  Cervix  Normal appearance by transabdomina scan.  Polyp seen at the  external os.  Uterus  No abnormality visualized.  Left Ovary  Within normal limits.  Right Ovary  Within normal limits.  Cul De Sac  No free fluid seen.  Adnexa  No abnormality visualized. ---------------------------------------------------------------------- Impression  Marisa Harvey, G1, P0 at [redacted] weeks gestation is here for a second  opinion evaluation.  On your office examination a mass  protruding from the external os was seen, which raised a  suspicion of products of conception versus cervical polyp.  Patient does not give history of vaginal bleeding.  We performed both transabdominal and trans-vaginal  ultrasound to better evaluate the pregnancy and the cervix.  The CRL measurement is consistent with her previously  established dates and good fetal heart activity seen.  The  cervix appears normal the distal end of the cervix (about 6  mm from the external loss) showed mild hyperechogenicity in  the canal.  No increased vascularity seen on color Doppler.  After explaining I performed sterile speculum examination.  The cervix appears healthy and a grayish-white mass is seen  protruding from the external loss.  I performed a vaginal  examination and the mass appeared slightly firm.  I repeated  the  speculum examination and there was no bleeding  following digital examination.  Impression: Degenerating cervical polyp.  I explained the findings with the ultrasound images and  reassured her that the mass seen is not consistent with  products of conception from vanishing twin.  I have reassured  her of singleton pregnancy.  In my opinion, the polyp can be easily removed without  complications and there is no extension into the uterine cavity  or deeper into the cervical canal.  I discussed the findings  with Dr. Gala Romney.  Dr. Rip Harbour will remove polyp in MAU this week.

## 2018-12-16 NOTE — Progress Notes (Signed)
Bedside U/S shows single IUP with CRL measuring 25.52mm  GA [redacted]w[redacted]d and FHT of 157 BPM

## 2018-12-16 NOTE — Progress Notes (Signed)
Pt thinks last pap smear was 3 years ago- normal

## 2018-12-17 ENCOUNTER — Telehealth: Payer: Self-pay | Admitting: *Deleted

## 2018-12-17 NOTE — Telephone Encounter (Signed)
-----   Message from Guss Bunde, MD sent at 12/17/2018  2:14 PM EDT ----- This patient is very complex and needs to be seen by MDs until the plan is firm.  She is not quite tucked in yet.  Can you get her scheduled with a physician for her next visit. (This is in additon to her MAU visit I jsut emailed you about, Zacarias Pontes).  Thanks,  [facility]

## 2018-12-17 NOTE — Telephone Encounter (Signed)
Not able to leave patient a message to call the office to schedule upcoming appointment with a MD instead of a Midwife per Dr. Gala Romney. Will send patient a [facility] message.

## 2018-12-18 ENCOUNTER — Telehealth: Payer: Self-pay | Admitting: *Deleted

## 2018-12-18 LAB — COMPREHENSIVE METABOLIC PANEL
AG Ratio: 1.7 (calc) (ref 1.0–2.5)
ALT: 12 U/L (ref 6–29)
AST: 11 U/L (ref 10–30)
Albumin: 4.1 g/dL (ref 3.6–5.1)
Alkaline phosphatase (APISO): 44 U/L (ref 31–125)
BUN: 8 mg/dL (ref 7–25)
CO2: 24 mmol/L (ref 20–32)
Calcium: 9.7 mg/dL (ref 8.6–10.2)
Chloride: 100 mmol/L (ref 98–110)
Creat: 0.63 mg/dL (ref 0.50–1.10)
Globulin: 2.4 g/dL (calc) (ref 1.9–3.7)
Glucose, Bld: 95 mg/dL (ref 65–99)
Potassium: 4 mmol/L (ref 3.5–5.3)
Sodium: 136 mmol/L (ref 135–146)
Total Bilirubin: 0.3 mg/dL (ref 0.2–1.2)
Total Protein: 6.5 g/dL (ref 6.1–8.1)

## 2018-12-18 LAB — OBSTETRIC PANEL
Absolute Monocytes: 546 cells/uL (ref 200–950)
Antibody Screen: NOT DETECTED
Basophils Absolute: 54 cells/uL (ref 0–200)
Basophils Relative: 0.5 %
Eosinophils Absolute: 86 cells/uL (ref 15–500)
Eosinophils Relative: 0.8 %
HCT: 37.7 % (ref 35.0–45.0)
Hemoglobin: 13.1 g/dL (ref 11.7–15.5)
Hepatitis B Surface Ag: NONREACTIVE
Lymphs Abs: 2226 cells/uL (ref 850–3900)
MCH: 33 pg (ref 27.0–33.0)
MCHC: 34.7 g/dL (ref 32.0–36.0)
MCV: 95 fL (ref 80.0–100.0)
MPV: 12.1 fL (ref 7.5–12.5)
Monocytes Relative: 5.1 %
Neutro Abs: 7790 cells/uL (ref 1500–7800)
Neutrophils Relative %: 72.8 %
Platelets: 246 10*3/uL (ref 140–400)
RBC: 3.97 10*6/uL (ref 3.80–5.10)
RDW: 12.5 % (ref 11.0–15.0)
RPR Ser Ql: NONREACTIVE
Rubella: 1.31 index
Total Lymphocyte: 20.8 %
WBC: 10.7 10*3/uL (ref 3.8–10.8)

## 2018-12-18 LAB — HEMOGLOBINOPATHY EVALUATION
Fetal Hemoglobin Testing: <1 % (ref 0.0–1.9)
HCT: 40.4 % (ref 35.0–45.0)
Hemoglobin A2 - HGBRFX: 2.6 % (ref 1.8–3.5)
Hemoglobin: 13.4 g/dL (ref 11.7–15.5)
Hgb A: 96.4 % (ref >96.0)
MCH: 32.4 pg (ref 27.0–33.0)
MCV: 97.6 fL (ref 80.0–100.0)
RBC: 4.14 10*6/uL (ref 3.80–5.10)
RDW: 12.3 % (ref 11.0–15.0)

## 2018-12-18 LAB — CULTURE, OB URINE

## 2018-12-18 LAB — URINE CULTURE, OB REFLEX: Organism ID, Bacteria: NO GROWTH

## 2018-12-18 LAB — HIV ANTIBODY (ROUTINE TESTING W REFLEX): HIV 1&2 Ab, 4th Generation: NONREACTIVE

## 2018-12-18 LAB — TSH: TSH: 0.86 mIU/L

## 2018-12-18 NOTE — Telephone Encounter (Signed)
Spoke with patient and gave her directions to [facility].  She is to go there on Thursday between 9-12 for Dr Rip Harbour to remove a cervical polyp.  She is pregnant and needed it done in hospital setting.  Dr Gala Romney has spoken to Dr Rip Harbour and he will be expecting her.

## 2018-12-19 ENCOUNTER — Encounter (HOSPITAL_COMMUNITY): Payer: Self-pay | Admitting: *Deleted

## 2018-12-19 ENCOUNTER — Inpatient Hospital Stay (EMERGENCY_DEPARTMENT_HOSPITAL)
Admission: AD | Admit: 2018-12-19 | Discharge: 2018-12-19 | Disposition: A | Discharge status: Home / Self Care | Payer: Medicare Other | Source: Home / Self Care | Attending: Obstetrics and Gynecology | Admitting: Obstetrics and Gynecology

## 2018-12-19 ENCOUNTER — Other Ambulatory Visit: Payer: Self-pay

## 2018-12-19 ENCOUNTER — Encounter (HOSPITAL_COMMUNITY): Payer: Self-pay | Admitting: Obstetrics and Gynecology

## 2018-12-19 ENCOUNTER — Inpatient Hospital Stay (HOSPITAL_COMMUNITY)
Admission: AD | Admit: 2018-12-19 | Discharge: 2018-12-19 | Disposition: A | Discharge status: Home / Self Care | Payer: Medicare Other | Attending: Obstetrics and Gynecology | Admitting: Obstetrics and Gynecology

## 2018-12-19 DIAGNOSIS — N939 Abnormal uterine and vaginal bleeding, unspecified: Secondary | ICD-10-CM | POA: Diagnosis not present

## 2018-12-19 DIAGNOSIS — N841 Polyp of cervix uteri: Secondary | ICD-10-CM | POA: Insufficient documentation

## 2018-12-19 DIAGNOSIS — Z9889 Other specified postprocedural states: Secondary | ICD-10-CM | POA: Diagnosis not present

## 2018-12-19 DIAGNOSIS — O209 Hemorrhage in early pregnancy, unspecified: Secondary | ICD-10-CM | POA: Diagnosis not present

## 2018-12-19 DIAGNOSIS — Z79899 Other long term (current) drug therapy: Secondary | ICD-10-CM | POA: Insufficient documentation

## 2018-12-19 DIAGNOSIS — Z87891 Personal history of nicotine dependence: Secondary | ICD-10-CM | POA: Diagnosis not present

## 2018-12-19 DIAGNOSIS — O26891 Other specified pregnancy related conditions, first trimester: Secondary | ICD-10-CM | POA: Diagnosis not present

## 2018-12-19 DIAGNOSIS — Z3A10 10 weeks gestation of pregnancy: Secondary | ICD-10-CM | POA: Insufficient documentation

## 2018-12-19 DIAGNOSIS — N72 Inflammatory disease of cervix uteri: Secondary | ICD-10-CM | POA: Diagnosis not present

## 2018-12-19 DIAGNOSIS — O3441 Maternal care for other abnormalities of cervix, first trimester: Secondary | ICD-10-CM | POA: Diagnosis not present

## 2018-12-19 MED ORDER — MONSELS FERRIC SUBSULFATE EX SOLN
Freq: Once | CUTANEOUS | Status: DC
  Filled 2018-12-19: qty 8

## 2018-12-19 MED ORDER — SILVER NITRATE-POT NITRATE 75-25 % EX MISC
3.0000 | Freq: Once | CUTANEOUS | Status: DC
  Filled 2018-12-19: qty 1

## 2018-12-19 MED ORDER — MONSELS FERRIC SUBSULFATE EX SOLN
Freq: Once | CUTANEOUS | Status: AC
  Administered 2018-12-19: 15:00:00 via TOPICAL
  Filled 2018-12-19: qty 8

## 2018-12-19 NOTE — MAU Provider Note (Addendum)
History     CSN: [id number]  Arrival date and time: 12/19/18 1318   First Provider Initiated Contact with Patient 12/19/18 1417      No chief complaint on file.  HPI  Ms.  Marisa Harvey is a 38 y.o. year old G3P0202 female at [redacted]w[redacted]d weeks gestation who presents to MAU reporting heavy vaginal bleeding once she got home from MAU earlier today. She was seen here for removal of a cervical polyp by Dr. Alysia Penna. Per Dr. Waynard Edwards note the cervical polyp was removes without difficulty or bleeding. He applied silver nitrate to the affected area. She was not bleeding at the time of d/c home. She denies any pain. She expresses concern that there is something wrong with the baby.  *Her spouse is present and contributes to HPI information taking.   Past Medical History:  Diagnosis Date  . Anxiety   . Carpal tunnel syndrome   . Chronic fatigue syndrome with fibromyalgia 08/07/2017  . Controlled substance agreement signed 08/21/2017   signed 08/21/17 see scanned docs   . Depression 2001  . Ehlers-Danlos disease   . Ehlers-Danlos syndrome type III 08/07/2017  . Fibromyalgia 01/2017  . History of mitral valve prolapse 08/07/2017  . Nephrolithiasis   . Neuropathy   . Osteopenia   . Persistent depressive disorder 09/18/2017  . POTS (postural orthostatic tachycardia syndrome)    with blackouts and tremors  . PTSD (post-traumatic stress disorder)   . Supraventricular tachycardia (HCC)   . SVT (supraventricular tachycardia) (HCC) 08/07/2017    Past Surgical History:  Procedure Laterality Date  . ABDOMINOPLASTY    . BREAST ENHANCEMENT SURGERY    . CESAREAN SECTION     2 pregnancies  . HERNIA REPAIR      Family History  Problem Relation Age of Onset  . Ehlers-Danlos syndrome Mother   . Ehlers-Danlos syndrome Maternal Grandmother   . Behavior problems Sister     Social History   Tobacco Use  . Smoking status: Former Smoker    Packs/day: 1.00    Years: 10.00    Pack years: 10.00    Quit  date: 11/05/2014    Years since quitting: 4.1  . Smokeless tobacco: Never Used  Substance Use Topics  . Alcohol use: Not Currently    Comment: rare, few times per year   . Drug use: Never    Allergies: No Known Allergies  Medications Prior to Admission  Medication Sig Dispense Refill Last Dose  . ARIPiprazole (ABILIFY) 20 MG tablet TAKE 1 TABLET(20 MG) BY MOUTH DAILY 30 tablet 0   . atenolol (TENORMIN) 25 MG tablet Take 1 tablet (25 mg total) by mouth 2 (two) times daily. 180 tablet 3   . HYDROcodone-acetaminophen (NORCO) 10-325 MG tablet Take 1 tablet by mouth every 4 (four) hours as needed.      . Prenatal Vit-Fe Fumarate-FA (MULTIVITAMIN-PRENATAL) 27-0.8 MG TABS tablet Take 1 tablet by mouth daily at 12 noon.     . traMADol (ULTRAM) 50 MG tablet Take 1-2 tablets (50-100 mg total) by mouth every 6 (six) hours as needed. 180 tablet 0   . vortioxetine HBr (TRINTELLIX) 20 MG TABS tablet Take 20 mg by mouth daily.       Review of Systems  Constitutional: Negative.   HENT: Negative.   Eyes: Negative.   Respiratory: Negative.   Cardiovascular: Negative.   Gastrointestinal: Negative.   Endocrine: Negative.   Genitourinary: Positive for vaginal bleeding (a large blood clot passed with lots of vaginal  bleeding once arrived at home).  Musculoskeletal: Negative.   Skin: Negative.   Allergic/Immunologic: Negative.   Neurological: Negative.   Hematological: Negative.   Psychiatric/Behavioral: Negative.    Physical Exam   Last menstrual period 10/10/2018.  Physical Exam  Nursing note and vitals reviewed. Constitutional: She is oriented to person, place, and time. She appears well-developed and well-nourished.  HENT:  Head: Normocephalic and atraumatic.  Eyes: Pupils are equal, round, and reactive to light.  Neck: Normal range of motion.  Cardiovascular: Normal rate.  Respiratory: Effort normal.  GI: Soft.  Genitourinary:    Genitourinary Comments: Speculum: Small amount of blood  clot in cervical os, unable to remove with 4 large cotton swab   Musculoskeletal: Normal range of motion.  Neurological: She is alert and oriented to person, place, and time.  Skin: Skin is warm and dry.  Psychiatric: She has a normal mood and affect. Her behavior is normal. Judgment and thought content normal.    MAU Course  Procedures Patient informed that the ultrasound is considered a limited OB ultrasound and is not intended to be a complete ultrasound exam.  Patient also informed that the ultrasound is not being completed with the intent of assessing for fetal or placental anomalies or any pelvic abnormalities.  Explained that the purpose of today's ultrasound is to assess for viability.  Baby was found to be viable. FHR visualized at 165-170 bpm. Patient and spouse shown the flickering heart on the screen. They both expressed a sense of relief with seeing this on the U/S screen. Patient acknowledges the purpose of the exam and the limitations of the study.   MDM Informal BS U/S Speculum Exam  *Consult with Dr. Rip Harbour @ 1431 - notified of patient's complaints of heavy VB after the procedure done in MAU by him -- on the way to re-evaluate patient -- Speculum exam done by Dr. Rip Harbour, small amount of mucoid blood coming from cervical os - Monsel's solution applied to cervix -- ok to d/c home  Assessment and Plan  Vagina bleeding - Return to MAU:  If you have heavier bleeding that soaks through more that 2 pads per hour for an hour or more  If you bleed so much that you feel like you might pass out or you do pass out  If you have significant abdominal pain that is not improved with Tylenol   If you develop a fever > 100.5 - Advised to expect to have dark, yellow or coffee ground type vaginal d/c for 10-14 days after today - Discharge home - Keep scheduled appt with [facility] on 12/23/18 - Patient verbalized an understanding of the plan of care and agrees.   Laury Deep,  MNS, CNM 12/19/2018, 2:17 PM   OB/GYN Attending  As noted above No active bleeding noted on cervical exam. Polyp stalk extension probable Elected not removed d/t recent bleeding Monsel's applied Pt tolerated well Pt instructed on pelvic rest and vaginal bleeding/discharge Pt has follow up appt next week in office  Arlina Robes, MD

## 2018-12-19 NOTE — Discharge Instructions (Signed)
Return to MAU:  If you have heavier bleeding that soaks through more that 2 pads per hour for an hour or more  If you bleed so much that you feel like you might pass out or you do pass out  If you have significant abdominal pain that is not improved with Tylenol   If you develop a fever > 100.5  Please expect to have dark yellow or coffee grounds type discharge for 10-14 days after today.

## 2018-12-19 NOTE — MAU Provider Note (Signed)
History     CSN: [id number]  Arrival date and time: 12/19/18 1019   First Provider Initiated Contact with Patient 12/19/18 1114      Chief Complaint  Patient presents with  . polyp removal   HPI 38 yo [id number] IUP 10 weeks presents in referral from Dr Grant Ruts for removal of cervical polyp noted on NOB exam earlier this week.  Pt reports no bleeding.    Past Medical History:  Diagnosis Date  . Anxiety   . Carpal tunnel syndrome   . Chronic fatigue syndrome with fibromyalgia 08/07/2017  . Controlled substance agreement signed 08/21/2017   signed 08/21/17 see scanned docs   . Depression 2001  . Ehlers-Danlos disease   . Ehlers-Danlos syndrome type III 08/07/2017  . Fibromyalgia 01/2017  . History of mitral valve prolapse 08/07/2017  . Nephrolithiasis   . Neuropathy   . Osteopenia   . Persistent depressive disorder 09/18/2017  . POTS (postural orthostatic tachycardia syndrome)    with blackouts and tremors  . PTSD (post-traumatic stress disorder)   . Supraventricular tachycardia (HCC)   . SVT (supraventricular tachycardia) (HCC) 08/07/2017    Past Surgical History:  Procedure Laterality Date  . ABDOMINOPLASTY    . BREAST ENHANCEMENT SURGERY    . CESAREAN SECTION     2 pregnancies  . HERNIA REPAIR      Family History  Problem Relation Age of Onset  . Ehlers-Danlos syndrome Mother   . Ehlers-Danlos syndrome Maternal Grandmother   . Behavior problems Sister     Social History   Tobacco Use  . Smoking status: Former Smoker    Packs/day: 1.00    Years: 10.00    Pack years: 10.00    Quit date: 11/05/2014    Years since quitting: 4.1  . Smokeless tobacco: Never Used  Substance Use Topics  . Alcohol use: Not Currently    Comment: rare, few times per year   . Drug use: Never    Allergies: No Known Allergies  Medications Prior to Admission  Medication Sig Dispense Refill Last Dose  . ARIPiprazole (ABILIFY) 20 MG tablet TAKE 1 TABLET(20 MG) BY MOUTH DAILY 30  tablet 0 12/18/2018 at Unknown time  . atenolol (TENORMIN) 25 MG tablet Take 1 tablet (25 mg total) by mouth 2 (two) times daily. 180 tablet 3 12/18/2018 at Unknown time  . HYDROcodone-acetaminophen (NORCO) 10-325 MG tablet Take 1 tablet by mouth every 4 (four) hours as needed.    12/19/2018 at Unknown time  . Prenatal Vit-Fe Fumarate-FA (MULTIVITAMIN-PRENATAL) 27-0.8 MG TABS tablet Take 1 tablet by mouth daily at 12 noon.   12/19/2018 at Unknown time  . traMADol (ULTRAM) 50 MG tablet Take 1-2 tablets (50-100 mg total) by mouth every 6 (six) hours as needed. 180 tablet 0 12/18/2018 at Unknown time  . vortioxetine HBr (TRINTELLIX) 20 MG TABS tablet Take 20 mg by mouth daily.   12/18/2018 at Unknown time    Review of Systems Physical Exam   Blood pressure 110/65, pulse 90, temperature 98.8 F (37.1 C), resp. rate 16, last menstrual period 10/10/2018.  Physical Exam Lungs clear Heart RRR Abd soft + BS GU nl EGUS 1 x 0.5 cm cervical polyp noted   MAU Course  Procedures Removal of cervical polyp.  Informed consent was obtained Speculum was placed and above findings.  Polyp was grasped with ring forceps and easily removed. No bleeding was noted. Silver nitrate was applied. Pt tolerated well.     Assessment and Plan  IUP 10 0/7 Cervical polyp, s/p removal   Pt discharged home with routine OB follow up.  Chancy Milroy 12/19/2018, 11:40 AM

## 2018-12-19 NOTE — MAU Note (Signed)
.   Marisa Harvey is a 38 y.o. at [redacted]w[redacted]d here in MAU reporting: she was told to come in for polyp removal.  LMP: 10/10/18  Pain score: 0 Vitals:   12/19/18 1041  BP: 110/65  Pulse: 90  Resp: 16  Temp: 98.8 F (37.1 C)     FHT: Lab orders placed from triage:

## 2018-12-20 LAB — SURGICAL PATHOLOGY

## 2018-12-23 ENCOUNTER — Ambulatory Visit (INDEPENDENT_AMBULATORY_CARE_PROVIDER_SITE_OTHER): Payer: Medicare Other | Admitting: Obstetrics & Gynecology

## 2018-12-23 ENCOUNTER — Other Ambulatory Visit: Payer: Self-pay

## 2018-12-23 VITALS — BP 114/73 | HR 88 | Wt 146.0 lb

## 2018-12-23 DIAGNOSIS — Z98891 History of uterine scar from previous surgery: Secondary | ICD-10-CM

## 2018-12-23 DIAGNOSIS — O09521 Supervision of elderly multigravida, first trimester: Secondary | ICD-10-CM

## 2018-12-23 DIAGNOSIS — O09529 Supervision of elderly multigravida, unspecified trimester: Secondary | ICD-10-CM

## 2018-12-23 DIAGNOSIS — Z3480 Encounter for supervision of other normal pregnancy, unspecified trimester: Secondary | ICD-10-CM | POA: Diagnosis not present

## 2018-12-23 DIAGNOSIS — O09899 Supervision of other high risk pregnancies, unspecified trimester: Secondary | ICD-10-CM | POA: Insufficient documentation

## 2018-12-23 DIAGNOSIS — O09891 Supervision of other high risk pregnancies, first trimester: Secondary | ICD-10-CM

## 2018-12-23 DIAGNOSIS — Z3A10 10 weeks gestation of pregnancy: Secondary | ICD-10-CM

## 2018-12-23 NOTE — Progress Notes (Signed)
Patient states she did have something "weird" come out and that it she was told by the doctor it was probably the stem of the poylp. Patient reports just now having a brown scant  blood discharge.  Kathrene Alu RN

## 2018-12-23 NOTE — Progress Notes (Signed)
   PRENATAL VISIT NOTE  Subjective:  Marisa Harvey is a 38 y.o. [id number] at [redacted]w[redacted]d being seen today for ongoing prenatal care.  She is currently monitored for the following issues for this high-risk pregnancy and has Ehlers-Danlos syndrome type III; POTS (postural orthostatic tachycardia syndrome); SVT (supraventricular tachycardia) ([place]); History of mitral valve prolapse; Neuropathy; Chronic fatigue syndrome with fibromyalgia; PTSD (post-traumatic stress disorder); Depression with anxiety; Osteopenia; Controlled substance agreement signed; Fibromyalgia; Persistent depressive disorder; Ruptured left breast implant; Vagina bleeding; Supervision of other high risk pregnancies, unspecified trimester; History of C-section; and History of preterm delivery, currently pregnant on their problem list.  Patient reports no complaints.  Contractions: Not present. Vag. Bleeding: Scant.  Movement: Absent. Denies leaking of fluid.   The following portions of the patient's history were reviewed and updated as appropriate: allergies, current medications, past family history, past medical history, past social history, past surgical history and problem list.   Objective:   Vitals:   12/23/18 1542  BP: 114/73  Pulse: 88  Weight: 146 lb (66.2 kg)    Fetal Status: Fetal Heart Rate (bpm): 160   Movement: Absent     General:  Alert, oriented and cooperative. Patient is in no acute distress.  Skin: Skin is warm and dry. No rash noted.   Cardiovascular: Normal heart rate noted  Respiratory: Normal respiratory effort, no problems with respiration noted  Abdomen: Soft, gravid, appropriate for gestational age.  Pain/Pressure: Present     Pelvic: Cervical exam performed       spec exam reveals brown mucous discharge  Extremities: Normal range of motion.  Edema: None  Mental Status: Normal mood and affect. Normal behavior. Normal judgment and thought content.   Assessment and Plan:  Pregnancy: [id number] at [redacted]w[redacted]d 1.  Supervision of other high risk pregnancies, unspecified trimester  - [place] MFM OB DETAIL +14 WK; Future  2. History of C-section x 2 - she is considering her options  3. History of preterm delivery, currently pregnant - at this time, she declines to start 17 P at 16 weeks  4. Supervision of other normal pregnancy, antepartum  - Genetic Screening - Hemoglobin A1c  5. Antepartum multigravida of advanced maternal age - she will come back next week for [place] and [place]  Preterm labor symptoms and general obstetric precautions including but not limited to vaginal bleeding, contractions, leaking of fluid and fetal movement were reviewed in detail with the patient. Please refer to After Visit Summary for other counseling recommendations.   Return in about 4 weeks (around 01/20/2019) for virtual.  No future appointments.  Emily Filbert, MD

## 2018-12-24 ENCOUNTER — Encounter: Payer: Medicare Other | Admitting: Advanced Practice Midwife

## 2018-12-24 LAB — CYTOLOGY - PAP
Chlamydia: NEGATIVE
Comment: NEGATIVE
Comment: NEGATIVE
Comment: NORMAL
Diagnosis: UNDETERMINED — AB
High risk HPV: NEGATIVE
Neisseria Gonorrhea: NEGATIVE

## 2018-12-24 LAB — HEMOGLOBIN A1C
Hgb A1c MFr Bld: 5 % of total Hgb (ref <5.7)
Mean Plasma Glucose: 97 (calc)
eAG (mmol/L): 5.4 (calc)

## 2018-12-24 MED ORDER — AMBULATORY NON FORMULARY MEDICATION: 1.0000 | 0 refills | Status: DC

## 2018-12-25 ENCOUNTER — Other Ambulatory Visit: Payer: Self-pay

## 2018-12-25 DIAGNOSIS — O0990 Supervision of high risk pregnancy, unspecified, unspecified trimester: Secondary | ICD-10-CM

## 2018-12-25 MED ORDER — AMBULATORY NON FORMULARY MEDICATION: 1.0000 | 0 refills | Status: AC

## 2018-12-30 ENCOUNTER — Ambulatory Visit: Payer: Medicare Other

## 2018-12-31 ENCOUNTER — Other Ambulatory Visit: Payer: Self-pay | Admitting: Osteopathic Medicine

## 2019-01-01 MED ORDER — ARIPIPRAZOLE 20 MG PO TABS: 20.0000 mg | ORAL_TABLET | Freq: Every day | ORAL | 0 refills | Status: DC

## 2019-01-02 ENCOUNTER — Ambulatory Visit: Payer: Medicare Other

## 2019-01-06 ENCOUNTER — Ambulatory Visit (INDEPENDENT_AMBULATORY_CARE_PROVIDER_SITE_OTHER): Payer: Medicare Other | Admitting: *Deleted

## 2019-01-06 ENCOUNTER — Other Ambulatory Visit: Payer: Self-pay

## 2019-01-06 DIAGNOSIS — Z3480 Encounter for supervision of other normal pregnancy, unspecified trimester: Secondary | ICD-10-CM | POA: Diagnosis not present

## 2019-01-06 NOTE — Progress Notes (Signed)
Pt here for Nipts only

## 2019-01-13 DIAGNOSIS — O09899 Supervision of other high risk pregnancies, unspecified trimester: Secondary | ICD-10-CM

## 2019-01-15 ENCOUNTER — Encounter: Payer: Self-pay | Admitting: *Deleted

## 2019-01-15 DIAGNOSIS — O09899 Supervision of other high risk pregnancies, unspecified trimester: Secondary | ICD-10-CM

## 2019-01-20 ENCOUNTER — Telehealth (INDEPENDENT_AMBULATORY_CARE_PROVIDER_SITE_OTHER): Payer: Medicare Other | Admitting: Obstetrics and Gynecology

## 2019-01-20 DIAGNOSIS — N939 Abnormal uterine and vaginal bleeding, unspecified: Secondary | ICD-10-CM

## 2019-01-20 DIAGNOSIS — Z98891 History of uterine scar from previous surgery: Secondary | ICD-10-CM

## 2019-01-20 DIAGNOSIS — O09899 Supervision of other high risk pregnancies, unspecified trimester: Secondary | ICD-10-CM

## 2019-01-20 DIAGNOSIS — Z3A14 14 weeks gestation of pregnancy: Secondary | ICD-10-CM

## 2019-01-20 DIAGNOSIS — O26892 Other specified pregnancy related conditions, second trimester: Secondary | ICD-10-CM

## 2019-01-20 DIAGNOSIS — O09892 Supervision of other high risk pregnancies, second trimester: Secondary | ICD-10-CM

## 2019-01-20 NOTE — Progress Notes (Signed)
   TELEHEALTH VIRTUAL OBSTETRICS VISIT ENCOUNTER NOTE  I connected with [name] on 01/20/19 at  9:15 AM EST by telephone at home and verified that I am speaking with the correct person using two identifiers.   I discussed the limitations, risks, security and privacy concerns of performing an evaluation and management service by telephone and the availability of in person appointments. I also discussed with the patient that there may be a patient responsible charge related to this service. The patient expressed understanding and agreed to proceed.  Subjective:  Marisa Harvey is a 38 y.o. [id number] at [redacted]w[redacted]d being followed for ongoing prenatal care.  She is currently monitored for the following issues for this high-risk pregnancy and has Ehlers-Danlos syndrome type III; POTS (postural orthostatic tachycardia syndrome); SVT (supraventricular tachycardia) ([place]); History of mitral valve prolapse; Neuropathy; Chronic fatigue syndrome with fibromyalgia; PTSD (post-traumatic stress disorder); Depression with anxiety; Osteopenia; Controlled substance agreement signed; Fibromyalgia; Persistent depressive disorder; Ruptured left breast implant; Vagina bleeding; Supervision of other high risk pregnancies, unspecified trimester; History of C-section; and History of preterm delivery, currently pregnant on their problem list.  Patient reports no complaints. Reports fetal movement. Denies any contractions, bleeding or leaking of fluid.   The following portions of the patient's history were reviewed and updated as appropriate: allergies, current medications, past family history, past medical history, past social history, past surgical history and problem list.   Objective:   General:  Alert, oriented and cooperative.   Mental Status: Normal mood and affect perceived. Normal judgment and thought content.  Rest of physical exam deferred due to type of encounter  Assessment and Plan:  Pregnancy: [id number] at  [redacted]w[redacted]d 1. Supervision of other high risk pregnancies, unspecified trimester  Doing well.   2. History of C-section 2 previous c-section. Uncertain about Vbac vs. Repeat   3. History of preterm delivery, currently pregnant  [redacted] weeks with previous babies.  Declined 17p     Preterm labor symptoms and general obstetric precautions including but not limited to vaginal bleeding, contractions, leaking of fluid and fetal movement were reviewed in detail with the patient.  I discussed the assessment and treatment plan with the patient. The patient was provided an opportunity to ask questions and all were answered. The patient agreed with the plan and demonstrated an understanding of the instructions. The patient was advised to call back or seek an in-person office evaluation/go to MAU at [facility] for any urgent or concerning symptoms. Please refer to After Visit Summary for other counseling recommendations.   I provided 13 minutes of non-face-to-face time during this encounter.  No follow-ups on file.  Future Appointments  Date Time Provider [place]  02/18/2019  2:00 PM Cassandra [place] 3 WH-MFCUS MFC-US  02/25/2019  1:30 PM Lajean Manes, CNM [facility]    Noni Saupe, NP Center for [facility], [place]

## 2019-01-20 NOTE — Progress Notes (Signed)
PT states she has not gotten BP cuff yet and she is unable to purchase one

## 2019-01-27 DIAGNOSIS — G894 Chronic pain syndrome: Secondary | ICD-10-CM | POA: Diagnosis not present

## 2019-01-27 DIAGNOSIS — Q7962 Hypermobile Ehlers-Danlos syndrome: Secondary | ICD-10-CM | POA: Diagnosis not present

## 2019-02-04 ENCOUNTER — Other Ambulatory Visit: Payer: Self-pay

## 2019-02-04 ENCOUNTER — Ambulatory Visit (INDEPENDENT_AMBULATORY_CARE_PROVIDER_SITE_OTHER): Payer: Medicare Other | Admitting: Advanced Practice Midwife

## 2019-02-04 VITALS — BP 106/67 | HR 109 | Temp 98.2°F | Wt 144.0 lb

## 2019-02-04 DIAGNOSIS — O26892 Other specified pregnancy related conditions, second trimester: Secondary | ICD-10-CM

## 2019-02-04 DIAGNOSIS — Z3A16 16 weeks gestation of pregnancy: Secondary | ICD-10-CM

## 2019-02-04 DIAGNOSIS — O09892 Supervision of other high risk pregnancies, second trimester: Secondary | ICD-10-CM

## 2019-02-04 DIAGNOSIS — O26899 Other specified pregnancy related conditions, unspecified trimester: Secondary | ICD-10-CM

## 2019-02-04 DIAGNOSIS — R519 Headache, unspecified: Secondary | ICD-10-CM

## 2019-02-04 DIAGNOSIS — O09899 Supervision of other high risk pregnancies, unspecified trimester: Secondary | ICD-10-CM

## 2019-02-04 NOTE — Patient Instructions (Signed)

## 2019-02-04 NOTE — Progress Notes (Signed)
Pt c/o headaches daily

## 2019-02-04 NOTE — Progress Notes (Signed)
   PRENATAL VISIT NOTE  Subjective:  Marisa Harvey is a 38 y.o. [id number] at [redacted]w[redacted]d being seen today for ongoing prenatal care.  She is currently monitored for the following issues for this high-risk pregnancy and has Ehlers-Danlos syndrome type III; POTS (postural orthostatic tachycardia syndrome); SVT (supraventricular tachycardia) ([place]); History of mitral valve prolapse; Neuropathy; Chronic fatigue syndrome with fibromyalgia; PTSD (post-traumatic stress disorder); Depression with anxiety; Osteopenia; Controlled substance agreement signed; Fibromyalgia; Persistent depressive disorder; Ruptured left breast implant; Vagina bleeding; Supervision of other high risk pregnancies, unspecified trimester; History of C-section; and History of preterm delivery, currently pregnant on their problem list.  Patient reports fatigue and headache. Pt reports nightly headaches for the last few weeks. Does not have headaches during the day. She reports lights make the headaches worse. She denies vision changes or dizziness. She has reports she recently switched from caffeinated coffee to decaf. Has not tried anything to help with headaches. Contractions: Not present. Vag. Bleeding: None.  Movement: Absent. Denies leaking of fluid.   The following portions of the patient's history were reviewed and updated as appropriate: allergies, current medications, past family history, past medical history, past social history, past surgical history and problem list.   Objective:   Vitals:   02/04/19 1414  BP: 106/67  Pulse: (!) 109  Temp: 98.2 F (36.8 C)  Weight: 65.3 kg    Fetal Status: Fetal Heart Rate (bpm): 156   Movement: Absent     General:  Alert, oriented and cooperative. Patient is in no acute distress.  Skin: Skin is warm and dry. No rash noted.   Cardiovascular: Normal heart rate noted  Respiratory: Normal respiratory effort, no problems with respiration noted  Abdomen: Soft, gravid, appropriate for  gestational age.  Pain/Pressure: Present     Pelvic: Cervical exam deferred        Extremities: Normal range of motion.  Edema: None  Mental Status: Normal mood and affect. Normal behavior. Normal judgment and thought content.   Assessment and Plan:  Pregnancy: [id number] at [redacted]w[redacted]d 1. Supervision of other high risk pregnancies, unspecified trimester - Routine prenatal care - C/o nightly headaches  2. Headache in pregnancy, antepartum - Pt c/o nightly headaches - Recommend Tylenol, increase water intake, and encouraged 1 cup of caffeine in the morning or afternoon to see if switching to decaf is a possible culprit - F/u in 2 weeks virtually to see if headaches have improved   Preterm labor symptoms and general obstetric precautions including but not limited to vaginal bleeding, contractions, leaking of fluid and fetal movement were reviewed in detail with the patient. Please refer to After Visit Summary for other counseling recommendations.   Return in about 4 weeks (around 03/04/2019).  Future Appointments  Date Time Provider [place]  02/18/2019  2:00 PM [facility]-MFC [place] 3 WH-MFCUS MFC-US  02/25/2019  1:30 PM Lajean Manes, CNM [facility]    Maryagnes Amos, SNM 2:37 PM

## 2019-02-17 ENCOUNTER — Encounter: Payer: Self-pay | Admitting: Obstetrics & Gynecology

## 2019-02-17 ENCOUNTER — Telehealth (INDEPENDENT_AMBULATORY_CARE_PROVIDER_SITE_OTHER): Payer: Medicare Other | Admitting: Obstetrics & Gynecology

## 2019-02-17 ENCOUNTER — Ambulatory Visit (HOSPITAL_COMMUNITY): Payer: Medicare Other

## 2019-02-17 DIAGNOSIS — O09899 Supervision of other high risk pregnancies, unspecified trimester: Secondary | ICD-10-CM

## 2019-02-17 DIAGNOSIS — N939 Abnormal uterine and vaginal bleeding, unspecified: Secondary | ICD-10-CM

## 2019-02-17 DIAGNOSIS — Z3A18 18 weeks gestation of pregnancy: Secondary | ICD-10-CM

## 2019-02-17 DIAGNOSIS — O09892 Supervision of other high risk pregnancies, second trimester: Secondary | ICD-10-CM

## 2019-02-17 DIAGNOSIS — Z98891 History of uterine scar from previous surgery: Secondary | ICD-10-CM

## 2019-02-17 MED ORDER — BLOOD PRESSURE CUFF MISC: 1.0000 | 0 refills | Status: AC | PRN

## 2019-02-17 MED ORDER — BLOOD PRESSURE CUFF MISC: 1.0000 | 0 refills | Status: DC | PRN

## 2019-02-17 MED ORDER — ASPIRIN EC 81 MG PO TBEC: 81.0000 mg | DELAYED_RELEASE_TABLET | Freq: Every day | ORAL | 6 refills | Status: AC

## 2019-02-17 NOTE — Addendum Note (Signed)
Addended by: Lyndal Rainbow on: 02/17/2019 04:55 PM   Modules accepted: Orders

## 2019-02-17 NOTE — Progress Notes (Signed)
TELEHEALTH VIRTUAL OBSTETRICS VISIT ENCOUNTER NOTE  I connected with Marisa Harvey on 02/17/19 at  1:50 PM EST by telephone at home and verified that I am speaking with the correct person using two identifiers.   I discussed the limitations, risks, security and privacy concerns of performing an evaluation and management service by telephone and the availability of in person appointments. I also discussed with the patient that there may be a patient responsible charge related to this service. The patient expressed understanding and agreed to proceed.  Subjective:  Marisa Harvey is a 38 y.o. [id number] at [redacted]w[redacted]d being followed for ongoing prenatal care.  She is currently monitored for the following issues for this high-risk pregnancy and has Ehlers-Danlos syndrome type III; POTS (postural orthostatic tachycardia syndrome); SVT (supraventricular tachycardia) (HCC); History of mitral valve prolapse; Neuropathy; Chronic fatigue syndrome with fibromyalgia; PTSD (post-traumatic stress disorder); Depression with anxiety; Osteopenia; Controlled substance agreement signed; Fibromyalgia; Persistent depressive disorder; Ruptured left breast implant; Vagina bleeding; Supervision of other high risk pregnancies, unspecified trimester; History of C-Harvey; and History of preterm delivery, currently pregnant on their problem list.  Patient reports fatigue. Reports fetal movement. Denies any contractions, bleeding or leaking of fluid.   The following portions of the patient's history were reviewed and updated as appropriate: allergies, current medications, past family history, past medical history, past social history, past surgical history and problem list.   Objective:   General:  Alert, oriented and cooperative.   Mental Status: Normal mood and affect perceived. Normal judgment and thought content.  Rest of physical exam deferred due to type of encounter  Assessment and Plan:  Pregnancy: [id number] at [redacted]w[redacted]d 1.  Supervision of other high risk pregnancies, unspecified trimester Pt needs to get BP cuff.  She is picking up Rx tomorrow and going to store to get free BP.    2. History of C-Harvey   3. History of preterm delivery, currently pregnant One was 8 pounds 7 ounces and one was 7 pounds 8 ounces; still need recoreds.  Had declined 17 P at earlier visit.  Pt to sign consent for record release. Check cervical length at anatomy US (connective tissue disorder)  4. Vagina bleeding none  5.  Fatigue and nausea:  October 2020 had normal TSH and CBC.  She has history of fatigue with POTS/Ehleros Canlos.  Pt has cardiologist and will make a f.u appt.  She is experiencing breathlessness with physical activity.  (Dr. Graciela Husbands)  6.  Headaches--better with adding caffeine.    7.  History of Pre E Baby aspirin daily   Preterm labor symptoms and general obstetric precautions including but not limited to vaginal bleeding, contractions, leaking of fluid and fetal movement were reviewed in detail with the patient.  I discussed the assessment and treatment plan with the patient. The patient was provided an opportunity to ask questions and all were answered. The patient agreed with the plan and demonstrated an understanding of the instructions. The patient was advised to call back or seek an in-person office evaluation/go to MAU at [facility] for any urgent or concerning symptoms. Please refer to After Visit Summary for other counseling recommendations.   I provided 20 minutes of non-face-to-face time during this encounter.  RTC 4 weeks  Future Appointments  Date Time Provider Department Center  02/17/2019  1:50 PM Lesly Dukes, MD [facility]  02/18/2019  2:00 PM WH-MFC NURSE WH-MFC MFC-US  02/18/2019  2:00 PM WH-MFC [place] 3 WH-MFCUS MFC-US  02/25/2019  1:30 PM Lajean Manes, CNM [facility]    Silas [place], [place] for [facility], [place]

## 2019-02-18 ENCOUNTER — Ambulatory Visit (HOSPITAL_COMMUNITY)
Admission: RE | Admit: 2019-02-18 | Discharge: 2019-02-18 | Disposition: A | Discharge status: Home / Self Care | Payer: Medicare Other | Source: Ambulatory Visit | Attending: Obstetrics & Gynecology | Admitting: Obstetrics & Gynecology

## 2019-02-18 ENCOUNTER — Ambulatory Visit (HOSPITAL_COMMUNITY): Payer: Medicare Other | Admitting: *Deleted

## 2019-02-18 ENCOUNTER — Encounter (HOSPITAL_COMMUNITY): Payer: Self-pay | Admitting: *Deleted

## 2019-02-18 ENCOUNTER — Other Ambulatory Visit: Payer: Self-pay

## 2019-02-18 DIAGNOSIS — O34219 Maternal care for unspecified type scar from previous cesarean delivery: Secondary | ICD-10-CM

## 2019-02-18 DIAGNOSIS — O09899 Supervision of other high risk pregnancies, unspecified trimester: Secondary | ICD-10-CM | POA: Diagnosis not present

## 2019-02-18 DIAGNOSIS — O09522 Supervision of elderly multigravida, second trimester: Secondary | ICD-10-CM | POA: Diagnosis not present

## 2019-02-18 DIAGNOSIS — O09292 Supervision of pregnancy with other poor reproductive or obstetric history, second trimester: Secondary | ICD-10-CM | POA: Diagnosis not present

## 2019-02-18 DIAGNOSIS — Z98891 History of uterine scar from previous surgery: Secondary | ICD-10-CM | POA: Insufficient documentation

## 2019-02-18 DIAGNOSIS — O2692 Pregnancy related conditions, unspecified, second trimester: Secondary | ICD-10-CM

## 2019-02-18 DIAGNOSIS — Z3A18 18 weeks gestation of pregnancy: Secondary | ICD-10-CM

## 2019-02-18 DIAGNOSIS — O09212 Supervision of pregnancy with history of pre-term labor, second trimester: Secondary | ICD-10-CM | POA: Diagnosis not present

## 2019-02-18 DIAGNOSIS — N939 Abnormal uterine and vaginal bleeding, unspecified: Secondary | ICD-10-CM | POA: Insufficient documentation

## 2019-02-18 DIAGNOSIS — O99322 Drug use complicating pregnancy, second trimester: Secondary | ICD-10-CM

## 2019-02-18 DIAGNOSIS — F1920 Other psychoactive substance dependence, uncomplicated: Secondary | ICD-10-CM

## 2019-02-19 ENCOUNTER — Other Ambulatory Visit (HOSPITAL_COMMUNITY): Payer: Self-pay | Admitting: *Deleted

## 2019-02-19 ENCOUNTER — Ambulatory Visit (HOSPITAL_COMMUNITY): Payer: Medicare Other

## 2019-02-19 DIAGNOSIS — O09529 Supervision of elderly multigravida, unspecified trimester: Secondary | ICD-10-CM

## 2019-02-25 ENCOUNTER — Encounter: Payer: Self-pay | Admitting: Certified Nurse Midwife

## 2019-02-25 ENCOUNTER — Ambulatory Visit (INDEPENDENT_AMBULATORY_CARE_PROVIDER_SITE_OTHER): Payer: Medicare Other | Admitting: Certified Nurse Midwife

## 2019-02-25 ENCOUNTER — Other Ambulatory Visit: Payer: Self-pay

## 2019-02-25 VITALS — BP 101/59 | HR 100 | Temp 98.3°F | Wt 151.0 lb

## 2019-02-25 DIAGNOSIS — O09892 Supervision of other high risk pregnancies, second trimester: Secondary | ICD-10-CM

## 2019-02-25 DIAGNOSIS — O09899 Supervision of other high risk pregnancies, unspecified trimester: Secondary | ICD-10-CM

## 2019-02-25 DIAGNOSIS — O09299 Supervision of pregnancy with other poor reproductive or obstetric history, unspecified trimester: Secondary | ICD-10-CM

## 2019-02-25 DIAGNOSIS — Z3A19 19 weeks gestation of pregnancy: Secondary | ICD-10-CM

## 2019-02-25 DIAGNOSIS — Z98891 History of uterine scar from previous surgery: Secondary | ICD-10-CM

## 2019-02-25 DIAGNOSIS — O09292 Supervision of pregnancy with other poor reproductive or obstetric history, second trimester: Secondary | ICD-10-CM

## 2019-02-25 NOTE — Patient Instructions (Signed)

## 2019-02-25 NOTE — Progress Notes (Signed)
PRENATAL VISIT NOTE  Subjective:  Marisa Harvey is a 38 y.o. [id number] at [redacted]w[redacted]d being seen today for ongoing prenatal care.  She is currently monitored for the following issues for this high-risk pregnancy and has Ehlers-Danlos syndrome type III; POTS (postural orthostatic tachycardia syndrome); SVT (supraventricular tachycardia) (HCC); History of mitral valve prolapse; Neuropathy; Chronic fatigue syndrome with fibromyalgia; PTSD (post-traumatic stress disorder); Depression with anxiety; Osteopenia; Controlled substance agreement signed; Fibromyalgia; Persistent depressive disorder; Ruptured left breast implant; Vagina bleeding; Supervision of other high risk pregnancies, unspecified trimester; History of C-section; History of preterm delivery, currently pregnant; and Hx of preeclampsia, prior pregnancy, currently pregnant on their problem list.  Patient reports no complaints. Pt reports she is planning to move back to [place] in the next 1-2 months to be closer to family. Contractions: Not present. Vag. Bleeding: None.  Movement: Present. Denies leaking of fluid.   The following portions of the patient's history were reviewed and updated as appropriate: allergies, current medications, past family history, past medical history, past social history, past surgical history and problem list.   Objective:   Vitals:   02/25/19 1322  BP: (!) 101/59  Pulse: 100  Temp: 98.3 F (36.8 C)  Weight: 151 lb (68.5 kg)    Fetal Status: Fetal Heart Rate (bpm): 154   Movement: Present     General:  Alert, oriented and cooperative. Patient is in no acute distress.  Skin: Skin is warm and dry. No rash noted.   Cardiovascular: Normal heart rate noted  Respiratory: Normal respiratory effort, no problems with respiration noted  Abdomen: Soft, gravid, appropriate for gestational age.  Pain/Pressure: Present     Pelvic: Cervical exam deferred        Extremities: Normal range of motion.  Edema: None  Mental  Status: Normal mood and affect. Normal behavior. Normal judgment and thought content.   Assessment and Plan:  Pregnancy: [id number] at [redacted]w[redacted]d 1. Supervision of other high risk pregnancies, unspecified trimester - Routine prenatal appointment. Pt doing well. No complaints today - Pt planning to move to [place] in the next 1-2 months - Pt unable to purchase BP cuff due to insurance not covering. She has Medicare and [facility]. Does not want to change Medicaid as she is moving back to [place]. Needs in person visits until she moves  2. History of C-section - plans for repeat C/S   3. History of preterm delivery, currently pregnant - Declined 17p  4. Hx of preeclampsia, prior pregnancy, currently pregnant - BP stable today - On bASA  Preterm labor symptoms and general obstetric precautions including but not limited to vaginal bleeding, contractions, leaking of fluid and fetal movement were reviewed in detail with the patient. Please refer to After Visit Summary for other counseling recommendations.   Return in about 4 weeks (around 03/25/2019) for ROB.  Future Appointments  Date Time Provider Department Center  03/04/2019  1:45 PM Sheilah Pigeon, [place] [facility]  04/01/2019  1:45 PM WH-MFC NURSE WH-MFC MFC-US  04/01/2019  1:45 PM WH-MFC [place] 2 WH-MFCUS MFC-US    Camelia Eng, SNM  I confirm that I have verified the information documented in the nurse midwife student's note and that I have also personally reperformed the history, physical exam and all medical decision making activities of this service and have verified that all service and findings are accurately documented in this student's note.   Patient doing well, no complaints Reports HA and N/V has improved  Patient plans to move  back to [facility] after next appointment and follow up US - will sign release of records at next appointment   Lajean Manes, [place] 02/25/2019 5:01 PM

## 2019-03-02 NOTE — Progress Notes (Deleted)
Cardiology Office Note Date:  03/02/2019  Patient ID:  Marisa Harvey, DOB 05-03-1980, MRN [id number] PCP:  Marisa Nielsen, DO  Cardiologist:  Dr. Jens Som Electrophysiologist: Dr. Graciela Husbands  ***refresh   Chief Complaint: ***  History of Present Illness: Marisa Harvey is a 38 y.o. female with history of anxiety/depression, chronic fatigue syndrome w/fibromyalgia PTSD, Ehlers-Danlos syndrome type III (hypermobility), orthostatic intolerance w/syncope, POTS (likely a manifestation of her Ehlers-Danlos syndrome type III), SVT  She comes in today to be seen for Dr. Graciela Husbands.  She last saw him via telehealth in may 2020.  She was on proamatine 10mg  BID. He mentioned SVT (presumed sinus) "We discussed extensively the issues of dysautonomia, the physiology of orthstasis and positional stress.  We discussed the role of salt and water repletion, the importance of exercise, often needing to be started in the recumbent position, and the awareness of triggers and the role of ambient heat and dehydration.  Discussed compressive wear and the possibility of trying flodrocortisone   She would like to hold on meds for now  Suggested nighttime shower  CHOP-POTS - exercise recommended" Planned for 4 mo follow up  She is currently *** pregnant She had a tele heath visit with her OB 02/17/2019, seems she was doing OK, mentioned breathless with exertion wih plans to f/u with cardiology.  Recommended to start home BP monitoring.  *** symptoms *** lifestyle management strategies *** off proamatine, on pain meds   Past Medical History:  Diagnosis Date  . Anxiety   . Carpal tunnel syndrome   . Chronic fatigue syndrome with fibromyalgia 08/07/2017  . Controlled substance agreement signed 08/21/2017   signed 08/21/17 see scanned docs   . Depression 2001  . Ehlers-Danlos disease   . Ehlers-Danlos syndrome type III 08/07/2017  . Fibromyalgia 01/2017  . History of mitral valve prolapse 08/07/2017  .  Nephrolithiasis   . Neuropathy   . Osteopenia   . Persistent depressive disorder 09/18/2017  . POTS (postural orthostatic tachycardia syndrome)    with blackouts and tremors  . PTSD (post-traumatic stress disorder)   . Supraventricular tachycardia (HCC)   . SVT (supraventricular tachycardia) (HCC) 08/07/2017    Past Surgical History:  Procedure Laterality Date  . ABDOMINOPLASTY    . BREAST ENHANCEMENT SURGERY    . CESAREAN SECTION     2 pregnancies  . HERNIA REPAIR      Current Outpatient Medications  Medication Sig Dispense Refill  . AMBULATORY NON FORMULARY MEDICATION 1 Device by Other route once a week. Blood Pressure Cuff/ Monitored regularly at home. ICD 10: LROB z34.90) 1 Device 0  . aspirin EC 81 MG tablet Take 1 tablet (81 mg total) by mouth daily. 30 tablet 6  . Blood Pressure Monitoring (BLOOD PRESSURE CUFF) MISC 1 Device by Does not apply route as needed. 1 each 0  . HYDROcodone-acetaminophen (NORCO) 10-325 MG tablet Take 1 tablet by mouth every 4 (four) hours as needed.     . Prenatal Vit-Fe Fumarate-FA (MULTIVITAMIN-PRENATAL) 27-0.8 MG TABS tablet Take 1 tablet by mouth daily at 12 noon.    . traMADol (ULTRAM) 50 MG tablet Take 1-2 tablets (50-100 mg total) by mouth every 6 (six) hours as needed. 180 tablet 0   No current facility-administered medications for this visit.    Allergies:   Patient has no known allergies.   Social History:  The patient  reports that she quit smoking about 4 years ago. She has a 10.00 pack-year smoking history. She has never used  smokeless tobacco. She reports previous alcohol use. She reports that she does not use drugs.   Family History:  The patient's family history includes Behavior problems in her sister; Ehlers-Danlos syndrome in her maternal grandmother and mother.  ROS:  Please see the history of present illness.  All other systems are reviewed and otherwise negative.   PHYSICAL EXAM: *** VS:  LMP 10/10/2018  BMI: There is no  height or weight on file to calculate BMI. Well nourished, well developed, in no acute distress  HEENT: normocephalic, atraumatic  Neck: no JVD, carotid bruits or masses Cardiac:  ***  RRR; no significant murmurs, no rubs, or gallops Lungs:  *** CTA b/l, no wheezing, rhonchi or rales  Abd: soft, nontender MS: no deformity or *** atrophy Ext: *** no edema  Skin: warm and dry, no rash Neuro:  No gross deficits appreciated Psych: euthymic mood, full affect   EKG:  Done today and reviewed by myself shows ***   11/13/2017: TTE Study Conclusions - Left ventricle: The cavity size was normal. Wall thickness was   normal. Systolic function was normal. The estimated ejection   fraction was in the range of 55% to 60%. Wall motion was normal;   there were no regional wall motion abnormalities. Doppler   parameters are consistent with abnormal left ventricular   relaxation (grade 1 diastolic dysfunction). - Mitral valve: Mild prolapse, involving the posterior leaflet.  Impressions - Normal LV systolic function; mild diastolic dysfunction; mild   prolapse of posterior MV leaflet; trace MR.    Recent Labs: 12/16/2018: ALT 12; BUN 8; Creat 0.63; Hemoglobin 13.1; Hemoglobin 13.4; Platelets 246; Potassium 4.0; Sodium 136; TSH 0.86  No results found for requested labs within last 8760 hours.   CrCl cannot be calculated (Patient's most recent lab result is older than the maximum 21 days allowed.).   Wt Readings from Last 3 Encounters:  02/25/19 151 lb (68.5 kg)  02/04/19 144 lb (65.3 kg)  01/20/19 146 lb (66.2 kg)     Other studies reviewed: Additional studies/records reviewed today include: summarized above  ASSESSMENT AND PLAN:  1. Ehlers-Danlos syndrome type III  2. Orthostatic intolerance     shower intolerance     POTS *** pregnant   ***    Disposition: F/u with ***  Current medicines are reviewed at length with the patient today.  The patient did not have any concerns  regarding medicines.***  Signed, Tommye Standard, PA-C 03/02/2019 4:22 PM     [address] [place] [address] [phone] (office)  [phone] (fax)

## 2019-03-04 ENCOUNTER — Ambulatory Visit: Payer: Medicare Other | Admitting: Physician Assistant

## 2019-03-25 ENCOUNTER — Ambulatory Visit (INDEPENDENT_AMBULATORY_CARE_PROVIDER_SITE_OTHER): Payer: Medicare Other | Admitting: Certified Nurse Midwife

## 2019-03-25 ENCOUNTER — Other Ambulatory Visit: Payer: Self-pay

## 2019-03-25 VITALS — BP 103/67 | HR 106 | Temp 98.2°F | Wt 152.0 lb

## 2019-03-25 DIAGNOSIS — Q7962 Hypermobile Ehlers-Danlos syndrome: Secondary | ICD-10-CM | POA: Diagnosis not present

## 2019-03-25 DIAGNOSIS — O09522 Supervision of elderly multigravida, second trimester: Secondary | ICD-10-CM

## 2019-03-25 DIAGNOSIS — O0992 Supervision of high risk pregnancy, unspecified, second trimester: Secondary | ICD-10-CM

## 2019-03-25 DIAGNOSIS — Z3A23 23 weeks gestation of pregnancy: Secondary | ICD-10-CM | POA: Diagnosis not present

## 2019-03-25 DIAGNOSIS — O09529 Supervision of elderly multigravida, unspecified trimester: Secondary | ICD-10-CM | POA: Insufficient documentation

## 2019-03-25 DIAGNOSIS — G894 Chronic pain syndrome: Secondary | ICD-10-CM | POA: Diagnosis not present

## 2019-03-25 DIAGNOSIS — O0990 Supervision of high risk pregnancy, unspecified, unspecified trimester: Secondary | ICD-10-CM

## 2019-03-25 DIAGNOSIS — Z79899 Other long term (current) drug therapy: Secondary | ICD-10-CM | POA: Diagnosis not present

## 2019-03-25 NOTE — Progress Notes (Signed)
Moving to [facility] and would need a referral to OB/Gyn

## 2019-03-25 NOTE — Progress Notes (Addendum)
   PRENATAL VISIT NOTE  Subjective:  Marisa Harvey is a 39 y.o. Q7Y1950 at [redacted]w[redacted]d being seen today for ongoing prenatal care.  She is currently monitored for the following issues for this high-risk pregnancy and has Ehlers-Danlos syndrome type III; POTS (postural orthostatic tachycardia syndrome); SVT (supraventricular tachycardia) (HCC); History of mitral valve prolapse; Neuropathy; Chronic fatigue syndrome with fibromyalgia; PTSD (post-traumatic stress disorder); Depression with anxiety; Osteopenia; Controlled substance agreement signed; Fibromyalgia; Persistent depressive disorder; Ruptured left breast implant; Vagina bleeding; Supervision of other high risk pregnancies, unspecified trimester; History of C-section; History of preterm delivery, currently pregnant; Hx of preeclampsia, prior pregnancy, currently pregnant; and Advanced maternal age in multigravida on their problem list.  Patient reports no complaints. She is moving back to [place] on 04/03/2019 to be closer to family and friends.   Contractions: Not present. Vag. Bleeding: None.  Movement: Present. Denies leaking of fluid.   The following portions of the patient's history were reviewed and updated as appropriate: allergies, current medications, past family history, past medical history, past social history, past surgical history and problem list.   Objective:   Vitals:   03/25/19 1406  BP: 103/67  Pulse: (!) 106  Temp: 98.2 F (36.8 C)  Weight: 68.9 kg    Fetal Status: Fetal Heart Rate (bpm): 146 Fundal Height: 23 cm Movement: Present     General:  Alert, oriented and cooperative. Patient is in no acute distress.  Skin: Skin is warm and dry. No rash noted.   Cardiovascular: Normal heart rate noted  Respiratory: Normal respiratory effort, no problems with respiration noted  Abdomen: Soft, gravid, appropriate for gestational age.  Pain/Pressure: Absent     Pelvic: Cervical exam deferred        Extremities: Normal range of  motion.  Edema: None  Mental Status: Normal mood and affect. Normal behavior. Normal judgment and thought content.   Assessment and Plan:  Pregnancy: [id number] at [redacted]w[redacted]d 1. Multigravida of advanced maternal age in second trimester - Patient is moving to [place] on 04/03/2019.  - She needs a referral sent to [facility] in [place]  - Ambulatory referral to Obstetrics / Gynecology  2. Supervision of high risk pregnancy, antepartum - Patient here for routine OB.  - Patient is doing well with no complaints.  - She has a growth ultrasound scheduled on 04/01/2019, which she plans to go to. - Patient is moving back to [place] on 04/03/2019 to be closer to family and friends for support once the baby arrives.  - Well wishes given to patient.   Preterm labor symptoms and general obstetric precautions including but not limited to vaginal bleeding, contractions, leaking of fluid and fetal movement were reviewed in detail with the patient. Please refer to After Visit Summary for other counseling recommendations.   No follow-ups on file.  Future Appointments  Date Time Provider Department Center  04/01/2019  1:45 PM [facility] NURSE [facility]  04/01/2019  1:45 PM [facility] [place] 2 [facility]    Camelia Eng, SNM  I confirm that I have verified the information documented in the nurse midwife student's note and that I have also personally reperformed the history, physical exam and all medical decision making activities of this service and have verified that all service and findings are accurately documented in this student's note.    Donette Larry, CNM 03/25/2019 4:25 PM

## 2019-03-26 DIAGNOSIS — Z79899 Other long term (current) drug therapy: Secondary | ICD-10-CM | POA: Diagnosis not present

## 2019-03-26 DIAGNOSIS — Z5181 Encounter for therapeutic drug level monitoring: Secondary | ICD-10-CM | POA: Diagnosis not present

## 2019-03-28 ENCOUNTER — Encounter: Payer: Self-pay | Admitting: Osteopathic Medicine

## 2019-03-28 DIAGNOSIS — G629 Polyneuropathy, unspecified: Secondary | ICD-10-CM

## 2019-03-28 DIAGNOSIS — M797 Fibromyalgia: Secondary | ICD-10-CM

## 2019-03-28 DIAGNOSIS — Z79899 Other long term (current) drug therapy: Secondary | ICD-10-CM

## 2019-03-28 DIAGNOSIS — G9332 Myalgic encephalomyelitis/chronic fatigue syndrome: Secondary | ICD-10-CM

## 2019-03-28 DIAGNOSIS — Q7962 Hypermobile Ehlers-Danlos syndrome: Secondary | ICD-10-CM

## 2019-04-01 ENCOUNTER — Ambulatory Visit (HOSPITAL_COMMUNITY)
Admission: RE | Admit: 2019-04-01 | Discharge: 2019-04-01 | Disposition: A | Discharge status: Home / Self Care | Payer: Medicare Other | Source: Ambulatory Visit | Attending: Maternal & Fetal Medicine | Admitting: Maternal & Fetal Medicine

## 2019-04-01 ENCOUNTER — Encounter (HOSPITAL_COMMUNITY): Payer: Self-pay | Admitting: *Deleted

## 2019-04-01 ENCOUNTER — Other Ambulatory Visit: Payer: Self-pay

## 2019-04-01 ENCOUNTER — Ambulatory Visit (HOSPITAL_COMMUNITY): Payer: Medicare Other | Admitting: *Deleted

## 2019-04-01 DIAGNOSIS — N939 Abnormal uterine and vaginal bleeding, unspecified: Secondary | ICD-10-CM

## 2019-04-01 DIAGNOSIS — Z3A24 24 weeks gestation of pregnancy: Secondary | ICD-10-CM | POA: Diagnosis not present

## 2019-04-01 DIAGNOSIS — O09522 Supervision of elderly multigravida, second trimester: Secondary | ICD-10-CM | POA: Insufficient documentation

## 2019-04-01 DIAGNOSIS — O34219 Maternal care for unspecified type scar from previous cesarean delivery: Secondary | ICD-10-CM | POA: Diagnosis not present

## 2019-04-01 DIAGNOSIS — Z98891 History of uterine scar from previous surgery: Secondary | ICD-10-CM | POA: Diagnosis not present

## 2019-04-01 DIAGNOSIS — Z362 Encounter for other antenatal screening follow-up: Secondary | ICD-10-CM

## 2019-04-01 DIAGNOSIS — O09899 Supervision of other high risk pregnancies, unspecified trimester: Secondary | ICD-10-CM

## 2019-04-01 DIAGNOSIS — O09529 Supervision of elderly multigravida, unspecified trimester: Secondary | ICD-10-CM | POA: Diagnosis not present

## 2019-04-01 DIAGNOSIS — O09212 Supervision of pregnancy with history of pre-term labor, second trimester: Secondary | ICD-10-CM | POA: Diagnosis not present

## 2019-04-23 DIAGNOSIS — G894 Chronic pain syndrome: Secondary | ICD-10-CM | POA: Diagnosis not present

## 2019-04-23 DIAGNOSIS — Z79891 Long term (current) use of opiate analgesic: Secondary | ICD-10-CM | POA: Diagnosis not present

## 2019-04-23 DIAGNOSIS — M545 Low back pain: Secondary | ICD-10-CM | POA: Diagnosis not present

## 2019-04-23 DIAGNOSIS — I10 Essential (primary) hypertension: Secondary | ICD-10-CM | POA: Diagnosis not present

## 2019-04-23 DIAGNOSIS — R5382 Chronic fatigue, unspecified: Secondary | ICD-10-CM | POA: Diagnosis not present

## 2019-04-23 DIAGNOSIS — Z79899 Other long term (current) drug therapy: Secondary | ICD-10-CM | POA: Diagnosis not present

## 2019-04-23 DIAGNOSIS — G629 Polyneuropathy, unspecified: Secondary | ICD-10-CM | POA: Diagnosis not present

## 2019-04-23 DIAGNOSIS — Q7962 Hypermobile Ehlers-Danlos syndrome: Secondary | ICD-10-CM | POA: Diagnosis not present

## 2019-04-28 DIAGNOSIS — F329 Major depressive disorder, single episode, unspecified: Secondary | ICD-10-CM | POA: Diagnosis not present

## 2019-04-28 DIAGNOSIS — G8929 Other chronic pain: Secondary | ICD-10-CM | POA: Diagnosis not present

## 2019-04-28 DIAGNOSIS — O99891 Other specified diseases and conditions complicating pregnancy: Secondary | ICD-10-CM | POA: Diagnosis not present

## 2019-04-28 DIAGNOSIS — I498 Other specified cardiac arrhythmias: Secondary | ICD-10-CM | POA: Diagnosis not present

## 2019-04-28 DIAGNOSIS — O09523 Supervision of elderly multigravida, third trimester: Secondary | ICD-10-CM | POA: Diagnosis not present

## 2019-04-28 DIAGNOSIS — O0993 Supervision of high risk pregnancy, unspecified, third trimester: Secondary | ICD-10-CM | POA: Diagnosis not present

## 2019-04-28 DIAGNOSIS — O99413 Diseases of the circulatory system complicating pregnancy, third trimester: Secondary | ICD-10-CM | POA: Diagnosis not present

## 2019-04-28 DIAGNOSIS — O99343 Other mental disorders complicating pregnancy, third trimester: Secondary | ICD-10-CM | POA: Diagnosis not present

## 2019-04-28 DIAGNOSIS — O34219 Maternal care for unspecified type scar from previous cesarean delivery: Secondary | ICD-10-CM | POA: Diagnosis not present

## 2019-04-28 DIAGNOSIS — Z283 Underimmunization status: Secondary | ICD-10-CM | POA: Diagnosis not present

## 2019-04-28 DIAGNOSIS — F419 Anxiety disorder, unspecified: Secondary | ICD-10-CM | POA: Diagnosis not present

## 2019-04-28 DIAGNOSIS — Q796 Ehlers-Danlos syndrome, unspecified: Secondary | ICD-10-CM | POA: Diagnosis not present

## 2019-04-28 DIAGNOSIS — O403XX0 Polyhydramnios, third trimester, not applicable or unspecified: Secondary | ICD-10-CM | POA: Diagnosis not present

## 2019-04-29 DIAGNOSIS — M150 Primary generalized (osteo)arthritis: Secondary | ICD-10-CM | POA: Diagnosis not present

## 2019-04-29 DIAGNOSIS — M797 Fibromyalgia: Secondary | ICD-10-CM | POA: Diagnosis not present

## 2019-04-29 DIAGNOSIS — Q796 Ehlers-Danlos syndrome, unspecified: Secondary | ICD-10-CM | POA: Diagnosis not present

## 2019-04-29 DIAGNOSIS — G5603 Carpal tunnel syndrome, bilateral upper limbs: Secondary | ICD-10-CM | POA: Diagnosis not present

## 2019-05-02 DIAGNOSIS — Z79891 Long term (current) use of opiate analgesic: Secondary | ICD-10-CM | POA: Diagnosis not present

## 2019-05-09 DIAGNOSIS — F3342 Major depressive disorder, recurrent, in full remission: Secondary | ICD-10-CM | POA: Diagnosis not present

## 2019-05-09 DIAGNOSIS — Q796 Ehlers-Danlos syndrome, unspecified: Secondary | ICD-10-CM | POA: Diagnosis not present

## 2019-05-09 DIAGNOSIS — R000 Tachycardia, unspecified: Secondary | ICD-10-CM | POA: Diagnosis not present

## 2019-05-16 DIAGNOSIS — Z363 Encounter for antenatal screening for malformations: Secondary | ICD-10-CM | POA: Diagnosis not present

## 2019-05-16 DIAGNOSIS — O09523 Supervision of elderly multigravida, third trimester: Secondary | ICD-10-CM | POA: Diagnosis not present

## 2019-05-16 DIAGNOSIS — O0993 Supervision of high risk pregnancy, unspecified, third trimester: Secondary | ICD-10-CM | POA: Diagnosis not present

## 2019-05-16 DIAGNOSIS — O09213 Supervision of pregnancy with history of pre-term labor, third trimester: Secondary | ICD-10-CM | POA: Diagnosis not present

## 2019-05-21 DIAGNOSIS — O09523 Supervision of elderly multigravida, third trimester: Secondary | ICD-10-CM | POA: Diagnosis not present

## 2019-05-21 DIAGNOSIS — O09213 Supervision of pregnancy with history of pre-term labor, third trimester: Secondary | ICD-10-CM | POA: Diagnosis not present

## 2019-05-21 DIAGNOSIS — Q796 Ehlers-Danlos syndrome, unspecified: Secondary | ICD-10-CM | POA: Diagnosis not present

## 2019-05-27 DIAGNOSIS — Z79891 Long term (current) use of opiate analgesic: Secondary | ICD-10-CM | POA: Diagnosis not present

## 2019-05-27 DIAGNOSIS — G5603 Carpal tunnel syndrome, bilateral upper limbs: Secondary | ICD-10-CM | POA: Diagnosis not present

## 2019-05-27 DIAGNOSIS — Q796 Ehlers-Danlos syndrome, unspecified: Secondary | ICD-10-CM | POA: Diagnosis not present

## 2019-05-27 DIAGNOSIS — M797 Fibromyalgia: Secondary | ICD-10-CM | POA: Diagnosis not present

## 2019-05-27 DIAGNOSIS — M150 Primary generalized (osteo)arthritis: Secondary | ICD-10-CM | POA: Diagnosis not present

## 2019-05-29 DIAGNOSIS — O09523 Supervision of elderly multigravida, third trimester: Secondary | ICD-10-CM | POA: Diagnosis not present

## 2019-05-29 DIAGNOSIS — Z23 Encounter for immunization: Secondary | ICD-10-CM | POA: Diagnosis not present

## 2019-05-29 DIAGNOSIS — R002 Palpitations: Secondary | ICD-10-CM | POA: Diagnosis not present

## 2019-05-29 DIAGNOSIS — Q796 Ehlers-Danlos syndrome, unspecified: Secondary | ICD-10-CM | POA: Diagnosis not present

## 2019-05-29 DIAGNOSIS — O403XX0 Polyhydramnios, third trimester, not applicable or unspecified: Secondary | ICD-10-CM | POA: Diagnosis not present

## 2019-05-29 DIAGNOSIS — I342 Nonrheumatic mitral (valve) stenosis: Secondary | ICD-10-CM | POA: Diagnosis not present

## 2019-06-04 DIAGNOSIS — Q796 Ehlers-Danlos syndrome, unspecified: Secondary | ICD-10-CM | POA: Diagnosis not present

## 2019-06-04 DIAGNOSIS — O09523 Supervision of elderly multigravida, third trimester: Secondary | ICD-10-CM | POA: Diagnosis not present

## 2019-06-04 DIAGNOSIS — O403XX0 Polyhydramnios, third trimester, not applicable or unspecified: Secondary | ICD-10-CM | POA: Diagnosis not present

## 2019-06-05 DIAGNOSIS — O99891 Other specified diseases and conditions complicating pregnancy: Secondary | ICD-10-CM | POA: Diagnosis present

## 2019-06-05 DIAGNOSIS — F419 Anxiety disorder, unspecified: Secondary | ICD-10-CM | POA: Diagnosis present

## 2019-06-05 DIAGNOSIS — M810 Age-related osteoporosis without current pathological fracture: Secondary | ICD-10-CM | POA: Diagnosis present

## 2019-06-05 DIAGNOSIS — Z20822 Contact with and (suspected) exposure to covid-19: Secondary | ICD-10-CM | POA: Diagnosis present

## 2019-06-05 DIAGNOSIS — M797 Fibromyalgia: Secondary | ICD-10-CM | POA: Diagnosis present

## 2019-06-05 DIAGNOSIS — O99353 Diseases of the nervous system complicating pregnancy, third trimester: Secondary | ICD-10-CM | POA: Diagnosis present

## 2019-06-05 DIAGNOSIS — O34211 Maternal care for low transverse scar from previous cesarean delivery: Secondary | ICD-10-CM | POA: Diagnosis present

## 2019-06-05 DIAGNOSIS — Z8759 Personal history of other complications of pregnancy, childbirth and the puerperium: Secondary | ICD-10-CM | POA: Diagnosis not present

## 2019-06-05 DIAGNOSIS — O09523 Supervision of elderly multigravida, third trimester: Secondary | ICD-10-CM | POA: Diagnosis not present

## 2019-06-05 DIAGNOSIS — I498 Other specified cardiac arrhythmias: Secondary | ICD-10-CM | POA: Diagnosis present

## 2019-06-05 DIAGNOSIS — Z283 Underimmunization status: Secondary | ICD-10-CM | POA: Diagnosis not present

## 2019-06-05 DIAGNOSIS — O99413 Diseases of the circulatory system complicating pregnancy, third trimester: Secondary | ICD-10-CM | POA: Diagnosis present

## 2019-06-05 DIAGNOSIS — F329 Major depressive disorder, single episode, unspecified: Secondary | ICD-10-CM | POA: Diagnosis present

## 2019-06-05 DIAGNOSIS — Z3A34 34 weeks gestation of pregnancy: Secondary | ICD-10-CM | POA: Diagnosis not present

## 2019-06-05 DIAGNOSIS — Q7962 Hypermobile Ehlers-Danlos syndrome: Secondary | ICD-10-CM | POA: Diagnosis not present

## 2019-06-05 DIAGNOSIS — O403XX0 Polyhydramnios, third trimester, not applicable or unspecified: Secondary | ICD-10-CM | POA: Diagnosis present

## 2019-06-05 DIAGNOSIS — O99343 Other mental disorders complicating pregnancy, third trimester: Secondary | ICD-10-CM | POA: Diagnosis present

## 2019-06-05 DIAGNOSIS — G894 Chronic pain syndrome: Secondary | ICD-10-CM | POA: Diagnosis present

## 2019-06-05 DIAGNOSIS — M1990 Unspecified osteoarthritis, unspecified site: Secondary | ICD-10-CM | POA: Diagnosis present

## 2019-06-11 DIAGNOSIS — O34219 Maternal care for unspecified type scar from previous cesarean delivery: Secondary | ICD-10-CM | POA: Diagnosis not present

## 2019-06-11 DIAGNOSIS — O09523 Supervision of elderly multigravida, third trimester: Secondary | ICD-10-CM | POA: Diagnosis not present

## 2019-06-11 DIAGNOSIS — O479 False labor, unspecified: Secondary | ICD-10-CM | POA: Diagnosis not present

## 2019-06-11 DIAGNOSIS — Q796 Ehlers-Danlos syndrome, unspecified: Secondary | ICD-10-CM | POA: Diagnosis not present

## 2019-06-11 DIAGNOSIS — O0993 Supervision of high risk pregnancy, unspecified, third trimester: Secondary | ICD-10-CM | POA: Diagnosis not present

## 2019-06-11 DIAGNOSIS — O403XX0 Polyhydramnios, third trimester, not applicable or unspecified: Secondary | ICD-10-CM | POA: Diagnosis not present

## 2019-06-11 DIAGNOSIS — O409XX0 Polyhydramnios, unspecified trimester, not applicable or unspecified: Secondary | ICD-10-CM | POA: Diagnosis not present

## 2019-06-12 DIAGNOSIS — O09523 Supervision of elderly multigravida, third trimester: Secondary | ICD-10-CM | POA: Diagnosis not present

## 2019-06-12 DIAGNOSIS — K0889 Other specified disorders of teeth and supporting structures: Secondary | ICD-10-CM | POA: Diagnosis not present

## 2019-06-12 DIAGNOSIS — F329 Major depressive disorder, single episode, unspecified: Secondary | ICD-10-CM | POA: Diagnosis not present

## 2019-06-12 DIAGNOSIS — F419 Anxiety disorder, unspecified: Secondary | ICD-10-CM | POA: Diagnosis not present

## 2019-06-12 DIAGNOSIS — Q7969 Other Ehlers-Danlos syndromes: Secondary | ICD-10-CM | POA: Diagnosis not present

## 2019-06-12 DIAGNOSIS — Z8679 Personal history of other diseases of the circulatory system: Secondary | ICD-10-CM | POA: Diagnosis not present

## 2019-06-12 DIAGNOSIS — Z9889 Other specified postprocedural states: Secondary | ICD-10-CM | POA: Diagnosis not present

## 2019-06-12 DIAGNOSIS — O403XX0 Polyhydramnios, third trimester, not applicable or unspecified: Secondary | ICD-10-CM | POA: Diagnosis not present

## 2019-06-12 DIAGNOSIS — Z3A35 35 weeks gestation of pregnancy: Secondary | ICD-10-CM | POA: Diagnosis not present

## 2019-06-12 DIAGNOSIS — Z8759 Personal history of other complications of pregnancy, childbirth and the puerperium: Secondary | ICD-10-CM | POA: Diagnosis not present

## 2019-06-12 DIAGNOSIS — O34211 Maternal care for low transverse scar from previous cesarean delivery: Secondary | ICD-10-CM | POA: Diagnosis not present

## 2019-06-12 DIAGNOSIS — G894 Chronic pain syndrome: Secondary | ICD-10-CM | POA: Diagnosis not present

## 2019-06-12 DIAGNOSIS — O99891 Other specified diseases and conditions complicating pregnancy: Secondary | ICD-10-CM | POA: Diagnosis not present

## 2019-06-12 DIAGNOSIS — Z9882 Breast implant status: Secondary | ICD-10-CM | POA: Diagnosis not present

## 2019-06-12 DIAGNOSIS — O99343 Other mental disorders complicating pregnancy, third trimester: Secondary | ICD-10-CM | POA: Diagnosis not present

## 2019-06-12 DIAGNOSIS — R110 Nausea: Secondary | ICD-10-CM | POA: Diagnosis not present

## 2019-06-12 DIAGNOSIS — K047 Periapical abscess without sinus: Secondary | ICD-10-CM | POA: Diagnosis not present

## 2019-06-12 DIAGNOSIS — O26893 Other specified pregnancy related conditions, third trimester: Secondary | ICD-10-CM | POA: Diagnosis not present

## 2019-06-12 DIAGNOSIS — Z283 Underimmunization status: Secondary | ICD-10-CM | POA: Diagnosis not present

## 2019-06-12 DIAGNOSIS — I498 Other specified cardiac arrhythmias: Secondary | ICD-10-CM | POA: Diagnosis not present

## 2019-06-12 DIAGNOSIS — O99413 Diseases of the circulatory system complicating pregnancy, third trimester: Secondary | ICD-10-CM | POA: Diagnosis not present

## 2019-06-12 DIAGNOSIS — O09293 Supervision of pregnancy with other poor reproductive or obstetric history, third trimester: Secondary | ICD-10-CM | POA: Diagnosis not present

## 2019-06-18 DIAGNOSIS — O09523 Supervision of elderly multigravida, third trimester: Secondary | ICD-10-CM | POA: Diagnosis not present

## 2019-06-18 DIAGNOSIS — O409XX0 Polyhydramnios, unspecified trimester, not applicable or unspecified: Secondary | ICD-10-CM | POA: Diagnosis not present

## 2019-06-18 DIAGNOSIS — Q796 Ehlers-Danlos syndrome, unspecified: Secondary | ICD-10-CM | POA: Diagnosis not present

## 2019-06-18 DIAGNOSIS — Q7961 Classical Ehlers-Danlos syndrome: Secondary | ICD-10-CM | POA: Diagnosis not present

## 2019-06-18 DIAGNOSIS — I499 Cardiac arrhythmia, unspecified: Secondary | ICD-10-CM | POA: Diagnosis not present

## 2019-06-18 DIAGNOSIS — O0993 Supervision of high risk pregnancy, unspecified, third trimester: Secondary | ICD-10-CM | POA: Diagnosis not present

## 2019-06-18 DIAGNOSIS — O403XX0 Polyhydramnios, third trimester, not applicable or unspecified: Secondary | ICD-10-CM | POA: Diagnosis not present

## 2019-06-18 DIAGNOSIS — O99413 Diseases of the circulatory system complicating pregnancy, third trimester: Secondary | ICD-10-CM | POA: Diagnosis not present

## 2019-06-24 DIAGNOSIS — G5603 Carpal tunnel syndrome, bilateral upper limbs: Secondary | ICD-10-CM | POA: Diagnosis not present

## 2019-06-24 DIAGNOSIS — M150 Primary generalized (osteo)arthritis: Secondary | ICD-10-CM | POA: Diagnosis not present

## 2019-06-24 DIAGNOSIS — Q796 Ehlers-Danlos syndrome, unspecified: Secondary | ICD-10-CM | POA: Diagnosis not present

## 2019-06-24 DIAGNOSIS — Z79891 Long term (current) use of opiate analgesic: Secondary | ICD-10-CM | POA: Diagnosis not present

## 2019-06-24 DIAGNOSIS — M797 Fibromyalgia: Secondary | ICD-10-CM | POA: Diagnosis not present

## 2019-06-25 DIAGNOSIS — Z01812 Encounter for preprocedural laboratory examination: Secondary | ICD-10-CM | POA: Diagnosis not present

## 2019-06-25 DIAGNOSIS — Z20822 Contact with and (suspected) exposure to covid-19: Secondary | ICD-10-CM | POA: Diagnosis not present

## 2019-06-26 DIAGNOSIS — O99344 Other mental disorders complicating childbirth: Secondary | ICD-10-CM | POA: Diagnosis present

## 2019-06-26 DIAGNOSIS — F419 Anxiety disorder, unspecified: Secondary | ICD-10-CM | POA: Diagnosis present

## 2019-06-26 DIAGNOSIS — O479 False labor, unspecified: Secondary | ICD-10-CM | POA: Diagnosis not present

## 2019-06-26 DIAGNOSIS — G8929 Other chronic pain: Secondary | ICD-10-CM | POA: Diagnosis not present

## 2019-06-26 DIAGNOSIS — Z283 Underimmunization status: Secondary | ICD-10-CM | POA: Diagnosis not present

## 2019-06-26 DIAGNOSIS — M797 Fibromyalgia: Secondary | ICD-10-CM | POA: Diagnosis present

## 2019-06-26 DIAGNOSIS — M1990 Unspecified osteoarthritis, unspecified site: Secondary | ICD-10-CM | POA: Diagnosis present

## 2019-06-26 DIAGNOSIS — Z302 Encounter for sterilization: Secondary | ICD-10-CM | POA: Diagnosis not present

## 2019-06-26 DIAGNOSIS — G629 Polyneuropathy, unspecified: Secondary | ICD-10-CM | POA: Diagnosis present

## 2019-06-26 DIAGNOSIS — Z3A37 37 weeks gestation of pregnancy: Secondary | ICD-10-CM | POA: Diagnosis not present

## 2019-06-26 DIAGNOSIS — O34219 Maternal care for unspecified type scar from previous cesarean delivery: Secondary | ICD-10-CM | POA: Diagnosis not present

## 2019-06-26 DIAGNOSIS — F329 Major depressive disorder, single episode, unspecified: Secondary | ICD-10-CM | POA: Diagnosis present

## 2019-06-26 DIAGNOSIS — Q796 Ehlers-Danlos syndrome, unspecified: Secondary | ICD-10-CM | POA: Diagnosis not present

## 2019-06-26 DIAGNOSIS — O9942 Diseases of the circulatory system complicating childbirth: Secondary | ICD-10-CM | POA: Diagnosis present

## 2019-06-26 DIAGNOSIS — M810 Age-related osteoporosis without current pathological fracture: Secondary | ICD-10-CM | POA: Diagnosis present

## 2019-06-26 DIAGNOSIS — Z20822 Contact with and (suspected) exposure to covid-19: Secondary | ICD-10-CM | POA: Diagnosis present

## 2019-06-26 DIAGNOSIS — O99354 Diseases of the nervous system complicating childbirth: Secondary | ICD-10-CM | POA: Diagnosis present

## 2019-06-26 DIAGNOSIS — G894 Chronic pain syndrome: Secondary | ICD-10-CM | POA: Diagnosis present

## 2019-06-26 DIAGNOSIS — I498 Other specified cardiac arrhythmias: Secondary | ICD-10-CM | POA: Diagnosis present

## 2019-06-26 DIAGNOSIS — O09523 Supervision of elderly multigravida, third trimester: Secondary | ICD-10-CM | POA: Diagnosis not present

## 2019-06-26 DIAGNOSIS — O99892 Other specified diseases and conditions complicating childbirth: Secondary | ICD-10-CM | POA: Diagnosis present

## 2019-06-26 DIAGNOSIS — G8918 Other acute postprocedural pain: Secondary | ICD-10-CM | POA: Diagnosis not present

## 2019-06-26 DIAGNOSIS — O403XX0 Polyhydramnios, third trimester, not applicable or unspecified: Secondary | ICD-10-CM | POA: Diagnosis present

## 2019-06-26 DIAGNOSIS — Q7962 Hypermobile Ehlers-Danlos syndrome: Secondary | ICD-10-CM | POA: Diagnosis not present

## 2019-06-26 DIAGNOSIS — O34211 Maternal care for low transverse scar from previous cesarean delivery: Secondary | ICD-10-CM | POA: Diagnosis present

## 2019-06-26 DIAGNOSIS — Z7982 Long term (current) use of aspirin: Secondary | ICD-10-CM | POA: Diagnosis not present

## 2019-07-22 DIAGNOSIS — G5603 Carpal tunnel syndrome, bilateral upper limbs: Secondary | ICD-10-CM | POA: Diagnosis not present

## 2019-07-22 DIAGNOSIS — Q796 Ehlers-Danlos syndrome, unspecified: Secondary | ICD-10-CM | POA: Diagnosis not present

## 2019-07-22 DIAGNOSIS — M150 Primary generalized (osteo)arthritis: Secondary | ICD-10-CM | POA: Diagnosis not present

## 2019-07-22 DIAGNOSIS — M797 Fibromyalgia: Secondary | ICD-10-CM | POA: Diagnosis not present

## 2019-07-22 DIAGNOSIS — Z79891 Long term (current) use of opiate analgesic: Secondary | ICD-10-CM | POA: Diagnosis not present

## 2019-08-19 DIAGNOSIS — Z79891 Long term (current) use of opiate analgesic: Secondary | ICD-10-CM | POA: Diagnosis not present

## 2019-09-15 DIAGNOSIS — Z79891 Long term (current) use of opiate analgesic: Secondary | ICD-10-CM | POA: Diagnosis not present

## 2019-09-25 DIAGNOSIS — F331 Major depressive disorder, recurrent, moderate: Secondary | ICD-10-CM | POA: Diagnosis not present

## 2019-11-13 DIAGNOSIS — G5603 Carpal tunnel syndrome, bilateral upper limbs: Secondary | ICD-10-CM | POA: Diagnosis not present

## 2019-11-13 DIAGNOSIS — M797 Fibromyalgia: Secondary | ICD-10-CM | POA: Diagnosis not present

## 2019-11-13 DIAGNOSIS — Z79891 Long term (current) use of opiate analgesic: Secondary | ICD-10-CM | POA: Diagnosis not present

## 2019-11-13 DIAGNOSIS — M150 Primary generalized (osteo)arthritis: Secondary | ICD-10-CM | POA: Diagnosis not present

## 2020-01-15 DIAGNOSIS — Z79891 Long term (current) use of opiate analgesic: Secondary | ICD-10-CM | POA: Diagnosis not present

## 2020-01-15 DIAGNOSIS — Q796 Ehlers-Danlos syndrome, unspecified: Secondary | ICD-10-CM | POA: Diagnosis not present

## 2020-01-15 DIAGNOSIS — G5603 Carpal tunnel syndrome, bilateral upper limbs: Secondary | ICD-10-CM | POA: Diagnosis not present

## 2020-01-15 DIAGNOSIS — M150 Primary generalized (osteo)arthritis: Secondary | ICD-10-CM | POA: Diagnosis not present

## 2020-01-15 DIAGNOSIS — M797 Fibromyalgia: Secondary | ICD-10-CM | POA: Diagnosis not present

## 2020-01-22 DIAGNOSIS — T8543XA Leakage of breast prosthesis and implant, initial encounter: Secondary | ICD-10-CM | POA: Diagnosis not present

## 2020-01-22 DIAGNOSIS — I471 Supraventricular tachycardia: Secondary | ICD-10-CM | POA: Diagnosis not present

## 2020-01-22 DIAGNOSIS — F3342 Major depressive disorder, recurrent, in full remission: Secondary | ICD-10-CM | POA: Diagnosis not present

## 2020-01-22 DIAGNOSIS — K469 Unspecified abdominal hernia without obstruction or gangrene: Secondary | ICD-10-CM | POA: Diagnosis not present

## 2020-01-22 DIAGNOSIS — R413 Other amnesia: Secondary | ICD-10-CM | POA: Diagnosis not present

## 2020-02-22 IMAGING — MG DIGITAL DIAGNOSTIC BILATERAL MAMMOGRAM WITH IMPLANTS, CAD AND TO
8 of 14 series · 8 of 34 positions shown · non-contrast
Comparison: Previous exam(s).

CLINICAL DATA: 38-year-old patient with palpable area of concern in
the medial left breast, with recent pain. History breast implants.

EXAM:
DIGITAL DIAGNOSTIC bilateral MAMMOGRAM WITH IMPLANTS, CAD AND TOMO
ULTRASOUND LEFT BREAST
The patient has retropectoral implants. Standard and implant
displaced views were performed.

[R CC]
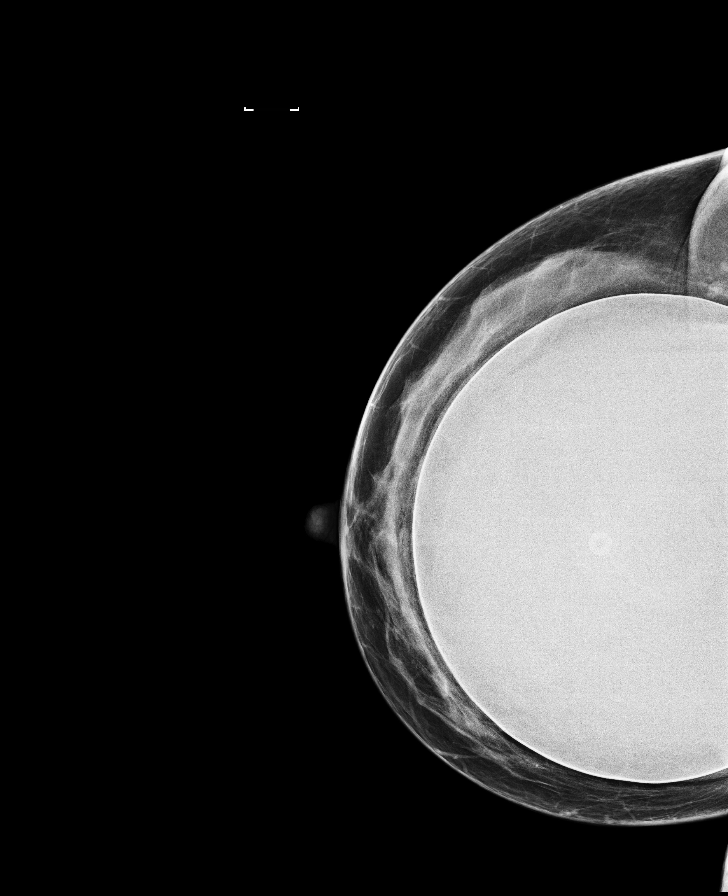

[R MLO]
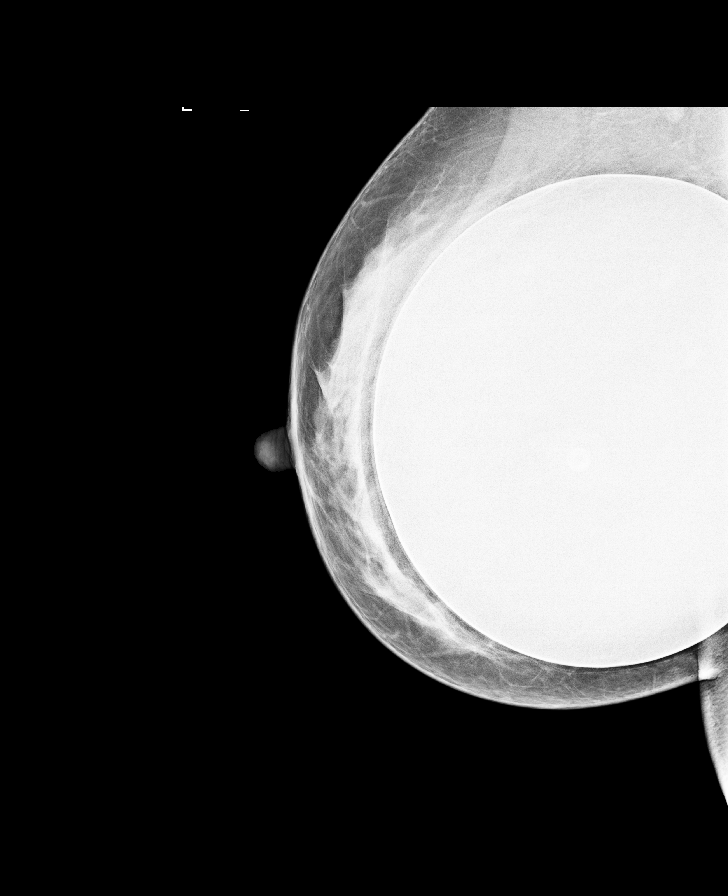

[L CC]
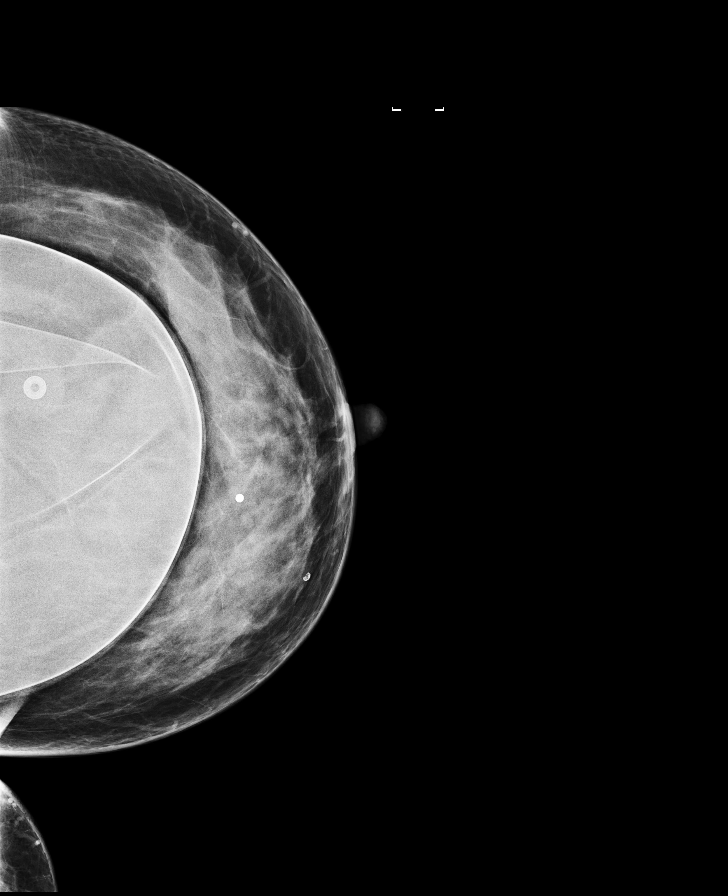

[L MLO]
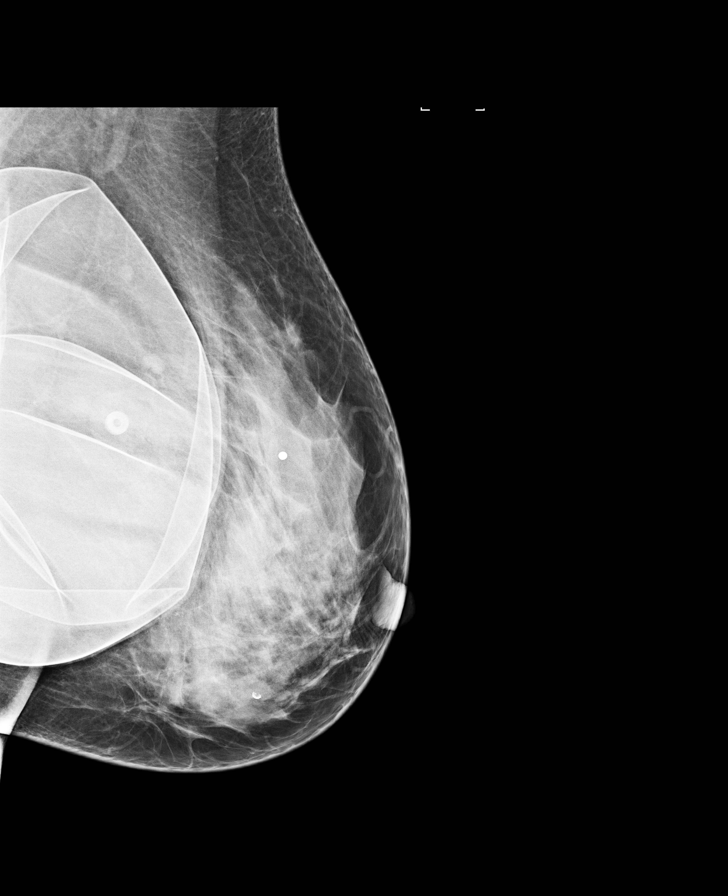

[L CC synth-2D]
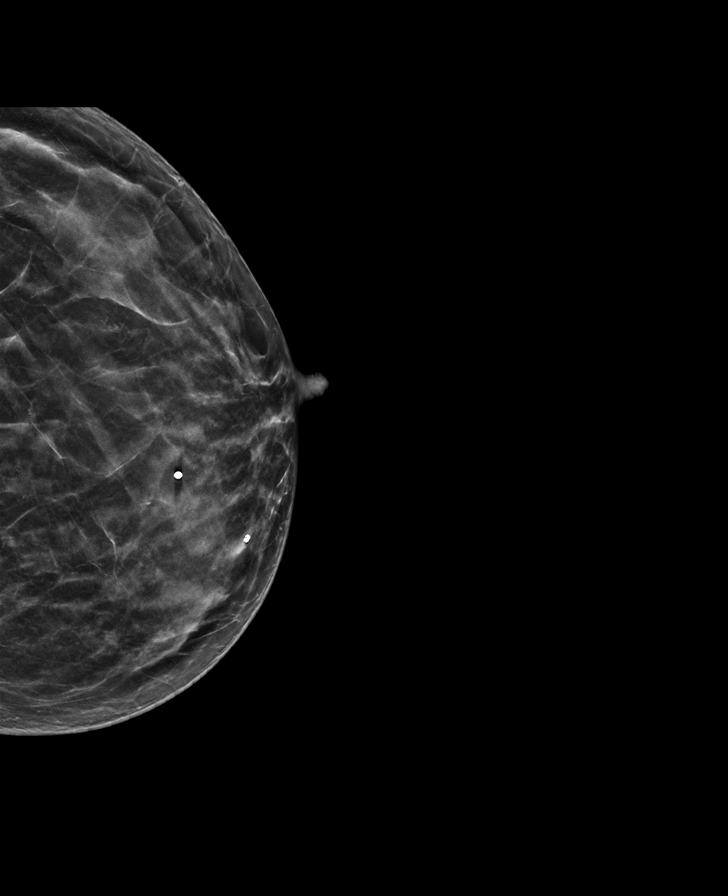

[L MLO synth-2D]
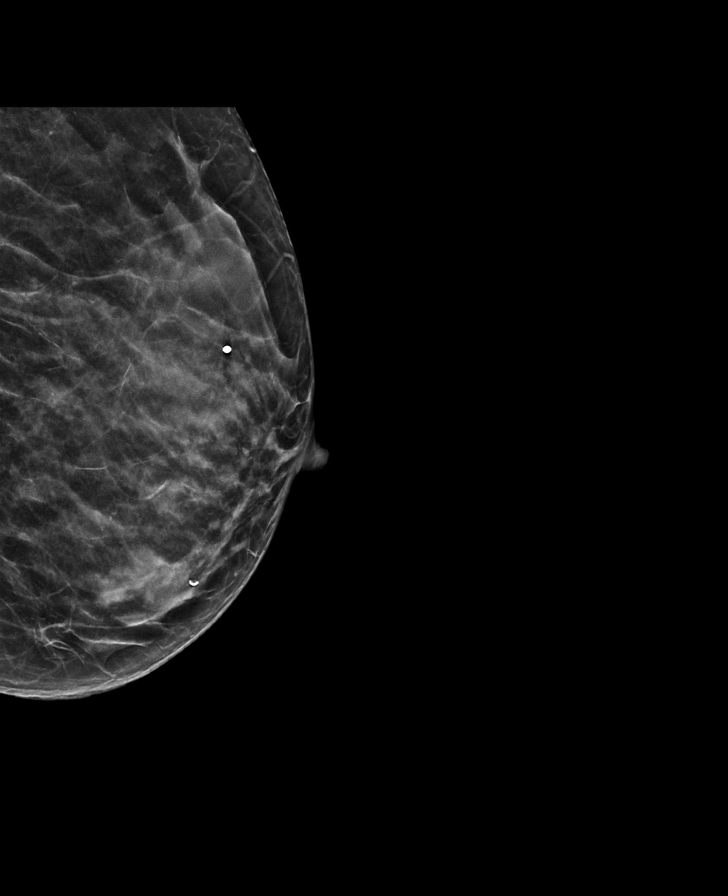

[R MLO synth-2D]
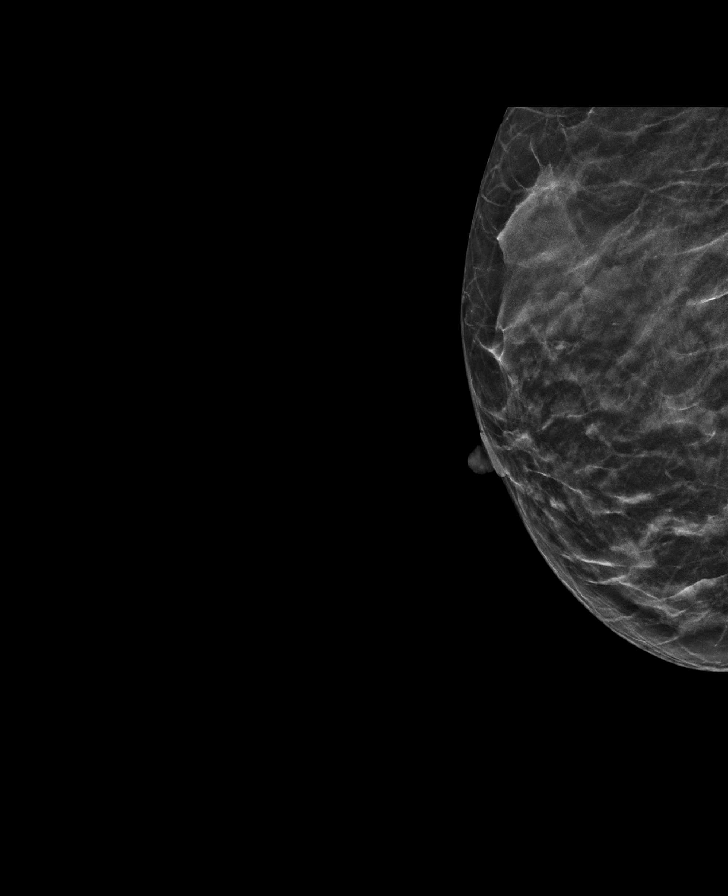

[L TAN synth-2D]
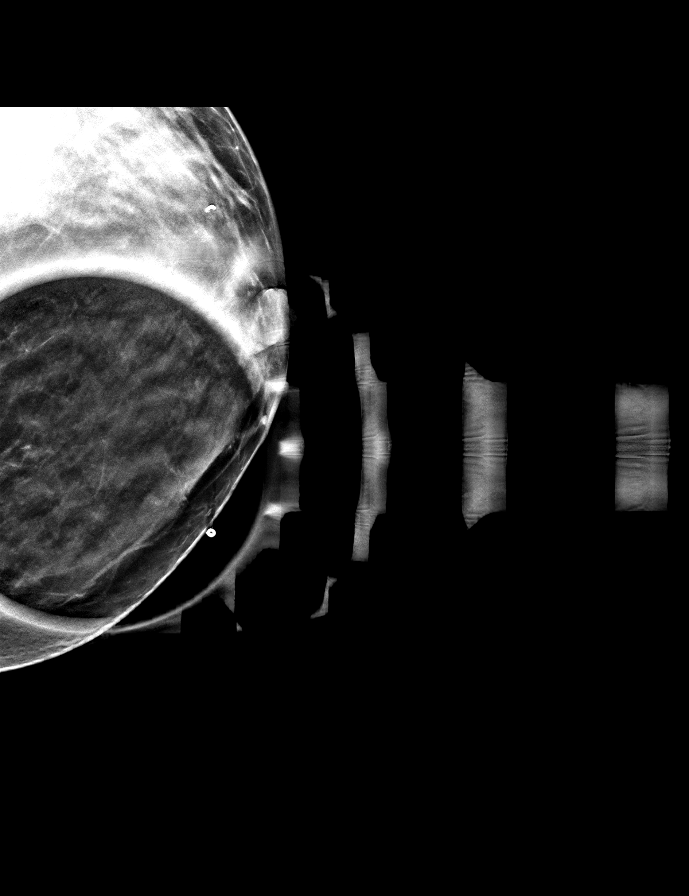

[8 of 34 positions shown; findings below may reference images not displayed]

ACR Breast Density Category b: There are scattered areas of
fibroglandular density.
FINDINGS: There is a palpable area of concern denoted with metallic skin
marker in the upper inner left breast. Spot tangential compression
view of this region shows fibroglandular tissue without a discrete
mass or distortion.

No mass, distortion, or suspicious microcalcification is identified
in either breast to suggest malignancy.

There are bilateral retropectoral saline breast implants. The left
breast implant is deflated. The right implant is intact.

On physical exam, I do not palpate a suspicious mass or thickening
in the upper inner left breast. In the 9 o'clock region, there is a
soft slight nodularity.

Targeted ultrasound is performed, showing normal fibroglandular
tissue in the upper inner and lower inner quadrants of the left
breast. Collapsed saline implant noted. No suspicious findings on
ultrasound.

Mammographic images were processed with CAD.
IMPRESSION: 1. No evidence of malignancy in either breast.
2. Collapsed/ruptured saline implant on the left.

RECOMMENDATION:
Screening mammogram in one year.(Code:JY-[id number])

Consider plastic surgery consultation for left breast implant
rupture.

I have discussed the findings and recommendations with the patient.
Results were also provided in writing at the conclusion of the
visit. If applicable, a reminder letter will be sent to the patient
regarding the next appointment.

BI-RADS CATEGORY  2: Benign.

## 2020-12-13 IMAGING — US US MFM OB DETAIL+14 WK
1 series · 13 of 28 positions shown · non-contrast
Comparison: none

[Series 1: us mfm ob detail+14 wk · 13 of 91 slices shown]
[im 4/91]
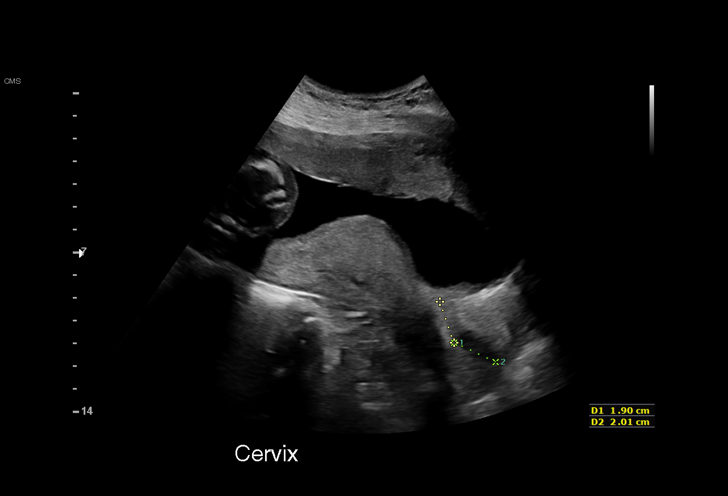
[im 11/91]
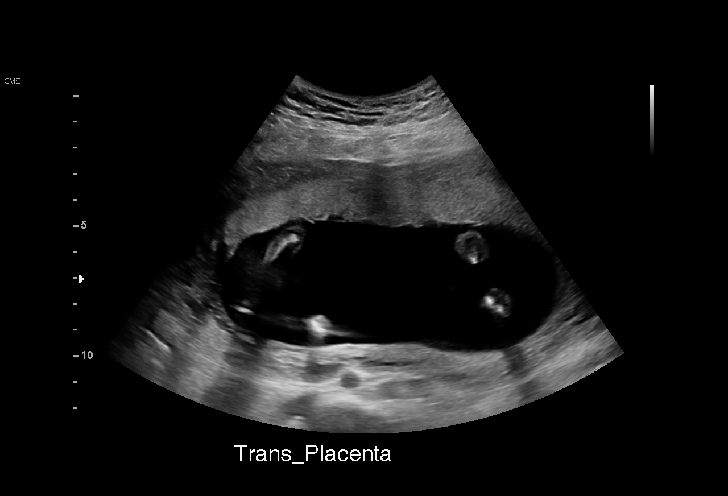
[im 17/91]
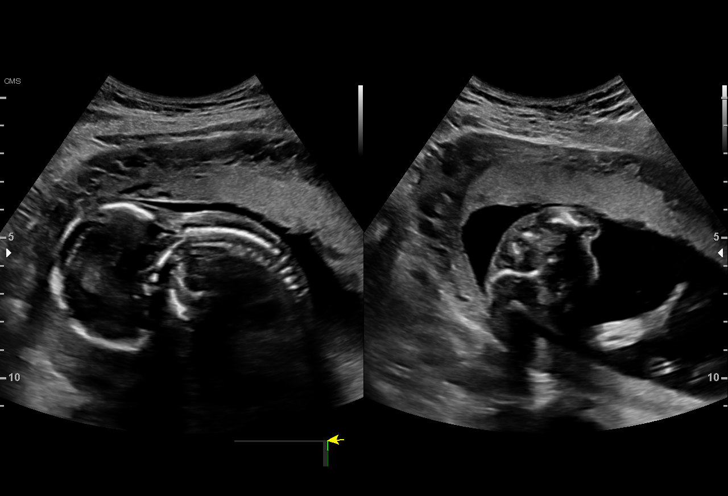
[im 24/91]
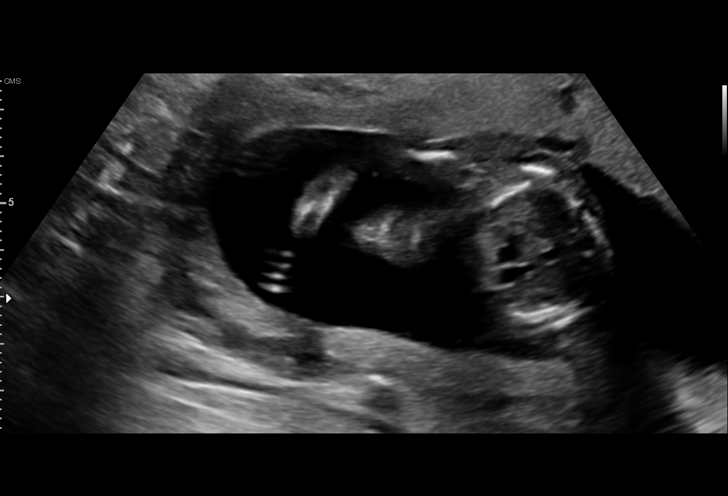
[im 31/91]
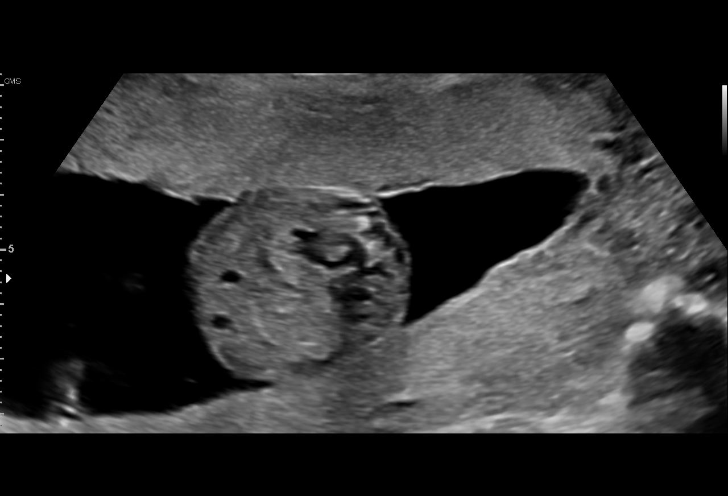
[im 37/91]
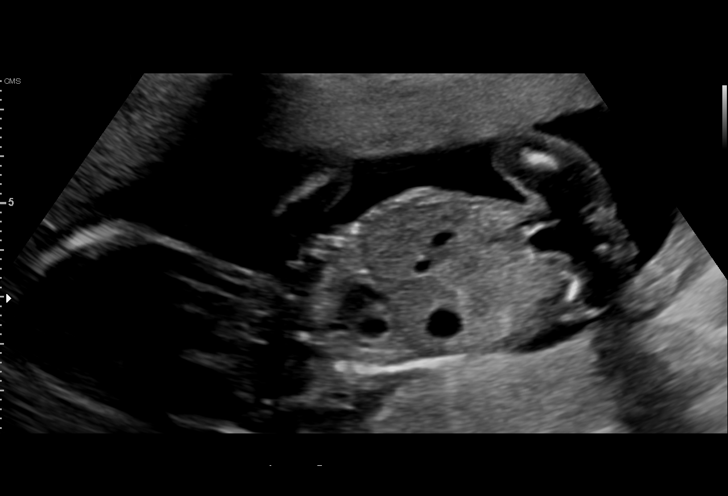
[im 47/91]
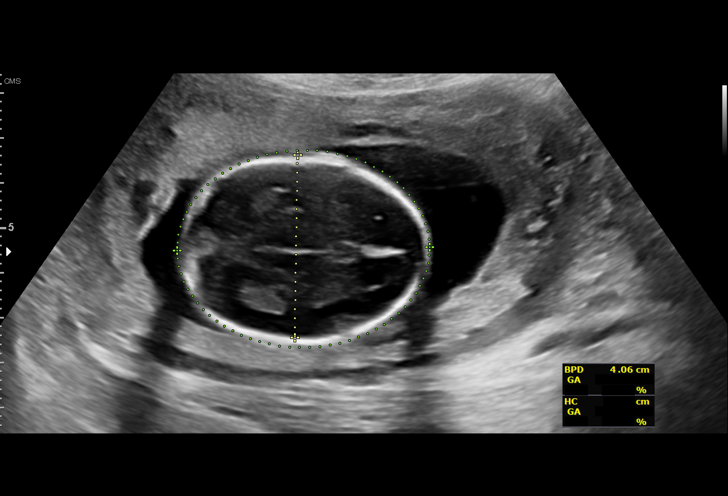
[im 54/91]
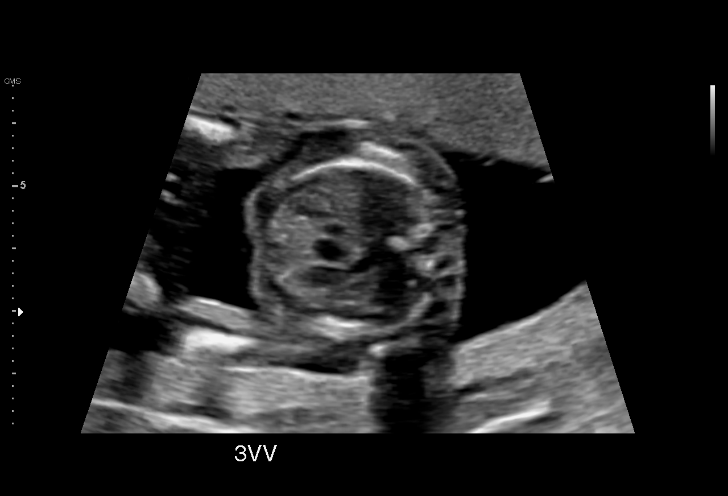
[im 61/91]
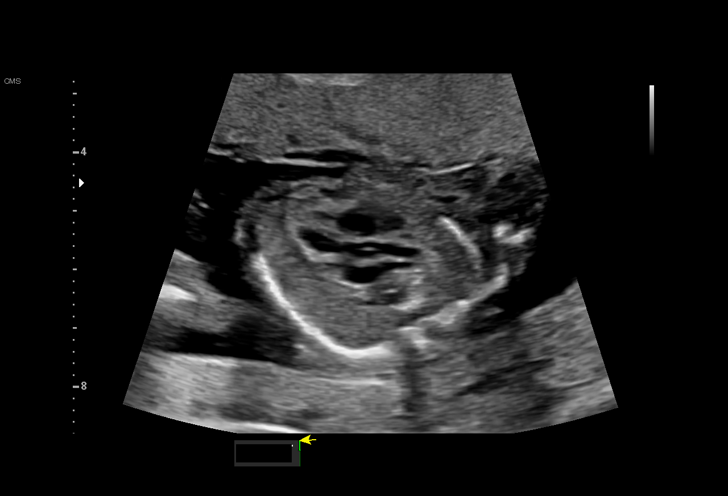
[im 67/91]
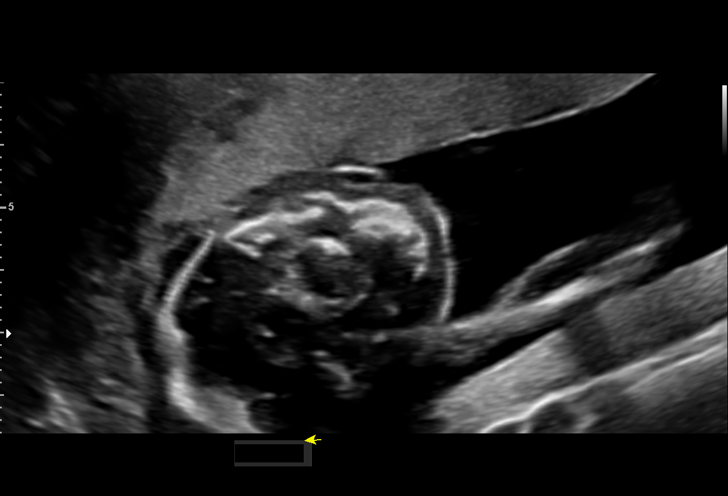
[im 74/91]
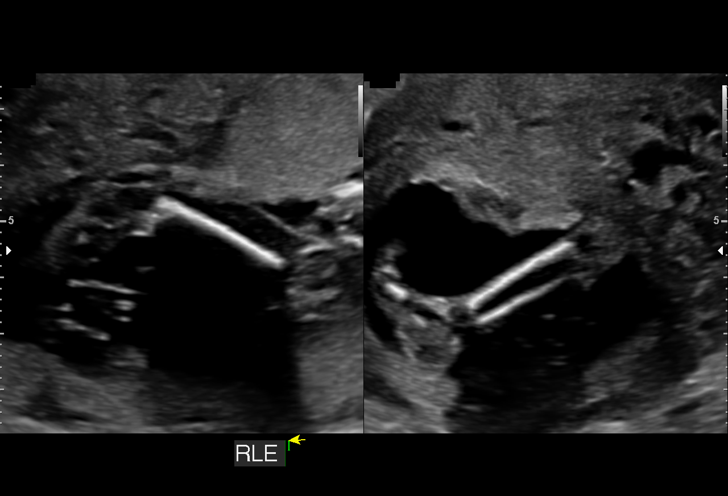
[im 81/91]
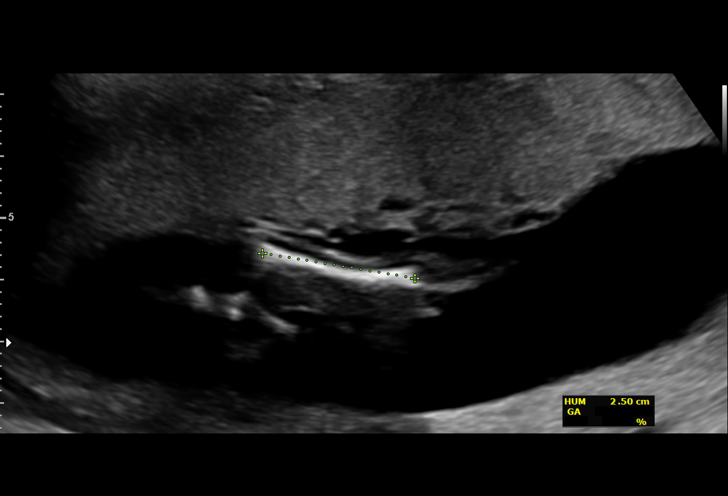
[im 87/91]
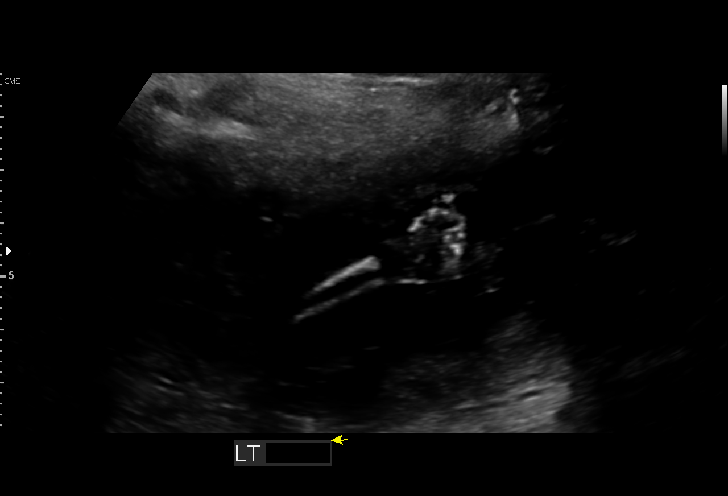

[13 of 28 positions shown; findings below may reference images not displayed]

----------------------------------------------------------------------

 ----------------------------------------------------------------------
Indications

  Antenatal screening for malformations
  Advanced maternal age multigravida 35+,
  second trimester
  Poor obstetric history: Previous preterm
  delivery, antepartum (36 wks x 2)
  Previous cesarean delivery, antepartum x 2
  Poor obstetric history: Previous
  preeclampsia / eclampsia/gestational HTN
  Drug dependence complicating pregnancy,
  antepartum condition or complication
  (opoids)
  Medical complication of pregnancy (SVT,
  MVP, Ehlers Danlos, POTS)
  18 weeks gestation of pregnancy
 ----------------------------------------------------------------------
Fetal Evaluation

 Num Of Fetuses:         1
 Fetal Heart Rate(bpm):  143
 Cardiac Activity:       Observed
 Presentation:           Transverse, head to maternal right
 Placenta:               Anterior
 P. Cord Insertion:      Visualized

 Amniotic Fluid
 AFI FV:      Within normal limits

                             Largest Pocket(cm)

Biometry
 BPD:      40.4  mm     G. Age:  18w 2d         30  %    CI:        67.35   %    70 - 86
                                                         FL/HC:      15.9   %    16.1 -
 HC:      157.6  mm     G. Age:  18w 5d         39  %    HC/AC:      1.22        1.09 -
 AC:      129.1  mm     G. Age:  18w 3d         38  %    FL/BPD:     62.1   %
 FL:       25.1  mm     G. Age:  17w 4d         10  %    FL/AC:      19.4   %    20 - 24
 HUM:      24.7  mm     G. Age:  17w 5d         23  %
 CER:      18.1  mm     G. Age:  18w 0d         28  %
 NFT:       3.3  mm

 CM:        3.7  mm

 Est. FW:     225  gm      0 lb 8 oz     16  %
OB History

 Gravidity:    3         Term:   0        Prem:   2        SAB:   0
 TOP:          0       Ectopic:  0        Living: 2
Gestational Age

 LMP:           18w 5d        Date:  10/10/18                 EDD:   07/17/19
 U/S Today:     18w 2d                                        EDD:   07/20/19
 Best:          18w 5d     Det. By:  LMP  (10/10/18)          EDD:   07/17/19
Anatomy

 Cranium:               Appears normal         LVOT:                   Appears normal
 Cavum:                 Appears normal         Aortic Arch:            Appears normal
 Ventricles:            Appears normal         Ductal Arch:            Appears normal
 Choroid Plexus:        Appears normal         Diaphragm:              Appears normal
 Cerebellum:            Appears normal         Stomach:                Appears normal, left
                                                                       sided
 Posterior Fossa:       Appears normal         Abdomen:                Appears normal
 Nuchal Fold:           Appears normal         Abdominal Wall:         Appears nml (cord
                                                                       insert, abd wall)
 Face:                  Appears normal         Cord Vessels:           Appears normal (3
                        (orbits and profile)                           vessel cord)
 Lips:                  Appears normal         Kidneys:                Appear normal
 Palate:                Appears normal         Bladder:                Appears normal
 Thoracic:              Appears normal         Spine:                  Appears normal
 Heart:                 Appears normal         Upper Extremities:      Appears normal
                        (4CH, axis, and
                        situs)
 RVOT:                  Appears normal         Lower Extremities:      Appears normal

 Other:  Heels/feet and open hands/LEFT 5th digit visualized. Nasal bone
         visualized.
Cervix Uterus Adnexa

 Cervix
 Length:            3.9  cm.
 Normal appearance by transabdominal scan.

 Uterus
 No abnormality visualized.

 Left Ovary
 Within normal limits.
 Right Ovary
 Not visualized.

 Cul De Sac
 No free fluid seen.

 Adnexa
 No abnormality visualized.
Impression

 Normal interval growth.
 AMA will be 39yo at the time of delivery.
 Opioid dependence
 History of cardiac SVT
 Ehlers-Danlos syndrome type III (Hypermobile type)
 Prior preterm deliveries associated with preeclampsia and
 failure toprogress secondary to chorioamniotis

 Good fetal movement and amniotic fluid.
Recommendations

 Follow up growth in 6 weeks.
 Serial growth eery 4 weeks
 Initiate weekly testing at 36 weeks given her AMA status and
 turning 39 prior to delivery.
 Consider low dose ASA daily given prior history of
 preeclampsia.

 Ms. Giorgi expressed that she and her husband were moving
 to [place].
